# Patient Record
Sex: Female | Born: 1975 | Race: White | Hispanic: No | Marital: Single | Smoking: Never smoker
Health system: Southern US, Community
[De-identification: ages and names within clinical notes are randomized; demographics above are authoritative.]

## PROBLEM LIST (undated history)

## (undated) DIAGNOSIS — N946 Dysmenorrhea, unspecified: Secondary | ICD-10-CM

## (undated) DIAGNOSIS — Z9889 Other specified postprocedural states: Secondary | ICD-10-CM

## (undated) DIAGNOSIS — Z8489 Family history of other specified conditions: Secondary | ICD-10-CM

## (undated) DIAGNOSIS — C439 Malignant melanoma of skin, unspecified: Secondary | ICD-10-CM

## (undated) DIAGNOSIS — R112 Nausea with vomiting, unspecified: Secondary | ICD-10-CM

## (undated) HISTORY — DX: Malignant melanoma of skin, unspecified: C43.9

## (undated) HISTORY — DX: Dysmenorrhea, unspecified: N94.6

## (undated) HISTORY — PX: MANDIBLE SURGERY: SHX707

## (undated) HISTORY — PX: MELANOMA EXCISION: SHX5266

---

## 2002-08-05 HISTORY — PX: ENDOMETRIAL ABLATION: SHX621

## 2005-08-05 HISTORY — PX: BREAST ENHANCEMENT SURGERY: SHX7

## 2012-10-26 DIAGNOSIS — D239 Other benign neoplasm of skin, unspecified: Secondary | ICD-10-CM | POA: Insufficient documentation

## 2018-02-13 ENCOUNTER — Other Ambulatory Visit: Payer: Self-pay

## 2018-02-13 ENCOUNTER — Other Ambulatory Visit (HOSPITAL_COMMUNITY)
Admission: RE | Admit: 2018-02-13 | Discharge: 2018-02-13 | Disposition: A | Discharge status: Home / Self Care | Payer: Managed Care, Other (non HMO) | Source: Ambulatory Visit | Attending: Advanced Practice Midwife | Admitting: Advanced Practice Midwife

## 2018-02-13 ENCOUNTER — Encounter: Payer: Self-pay | Admitting: Advanced Practice Midwife

## 2018-02-13 ENCOUNTER — Ambulatory Visit (INDEPENDENT_AMBULATORY_CARE_PROVIDER_SITE_OTHER): Payer: Managed Care, Other (non HMO) | Admitting: Advanced Practice Midwife

## 2018-02-13 VITALS — BP 110/78 | HR 66 | Ht 64.0 in | Wt 123.0 lb

## 2018-02-13 DIAGNOSIS — Z01419 Encounter for gynecological examination (general) (routine) without abnormal findings: Secondary | ICD-10-CM | POA: Diagnosis present

## 2018-02-13 DIAGNOSIS — Z124 Encounter for screening for malignant neoplasm of cervix: Secondary | ICD-10-CM | POA: Insufficient documentation

## 2018-02-13 DIAGNOSIS — Z1239 Encounter for other screening for malignant neoplasm of breast: Secondary | ICD-10-CM

## 2018-02-13 DIAGNOSIS — Z1231 Encounter for screening mammogram for malignant neoplasm of breast: Secondary | ICD-10-CM | POA: Diagnosis not present

## 2018-02-13 DIAGNOSIS — Z113 Encounter for screening for infections with a predominantly sexual mode of transmission: Secondary | ICD-10-CM | POA: Insufficient documentation

## 2018-02-13 DIAGNOSIS — Z0000 Encounter for general adult medical examination without abnormal findings: Secondary | ICD-10-CM

## 2018-02-13 NOTE — Patient Instructions (Signed)
Mediterranean Diet A Mediterranean diet refers to food and lifestyle choices that are based on the traditions of countries located on the [place]. This way of eating has been shown to help prevent certain conditions and improve outcomes for people who have chronic diseases, like kidney disease and heart disease. What are tips for following this plan? Lifestyle  Cook and eat meals together with your family, when possible.  Drink enough fluid to keep your urine clear or pale yellow.  Be physically active every day. This includes: ? Aerobic exercise like running or swimming. ? Leisure activities like gardening, walking, or housework.  Get 7-8 hours of sleep each night.  If recommended by your health care provider, drink red wine in moderation. This means 1 glass a day for nonpregnant women and 2 glasses a day for men. A glass of wine equals 5 oz (150 mL). Reading food labels  Check the serving size of packaged foods. For foods such as rice and pasta, the serving size refers to the amount of cooked product, not dry.  Check the total fat in packaged foods. Avoid foods that have saturated fat or trans fats.  Check the ingredients list for added sugars, such as corn syrup. Shopping  At the grocery store, buy most of your food from the areas near the walls of the store. This includes: ? Fresh fruits and vegetables (produce). ? Grains, beans, nuts, and seeds. Some of these may be available in unpackaged forms or large amounts (in bulk). ? Fresh seafood. ? Poultry and eggs. ? Low-fat dairy products.  Buy whole ingredients instead of prepackaged foods.  Buy fresh fruits and vegetables in-season from local farmers markets.  Buy frozen fruits and vegetables in resealable bags.  If you do not have access to quality fresh seafood, buy precooked frozen shrimp or canned fish, such as tuna, salmon, or sardines.  Buy small amounts of raw or cooked vegetables, salads, or olives from the  deli or salad bar at your store.  Stock your pantry so you always have certain foods on hand, such as olive oil, canned tuna, canned tomatoes, rice, pasta, and beans. Cooking  Cook foods with extra-virgin olive oil instead of using butter or other vegetable oils.  Have meat as a side dish, and have vegetables or grains as your main dish. This means having meat in small portions or adding small amounts of meat to foods like pasta or stew.  Use beans or vegetables instead of meat in common dishes like chili or lasagna.  Experiment with different cooking methods. Try roasting or broiling vegetables instead of steaming or sauteing them.  Add frozen vegetables to soups, stews, pasta, or rice.  Add nuts or seeds for added healthy fat at each meal. You can add these to yogurt, salads, or vegetable dishes.  Marinate fish or vegetables using olive oil, lemon juice, garlic, and fresh herbs. Meal planning  Plan to eat 1 vegetarian meal one day each week. Try to work up to 2 vegetarian meals, if possible.  Eat seafood 2 or more times a week.  Have healthy snacks readily available, such as: ? Vegetable sticks with hummus. ? Greek yogurt. ? Fruit and nut trail mix.  Eat balanced meals throughout the week. This includes: ? Fruit: 2-3 servings a day ? Vegetables: 4-5 servings a day ? Low-fat dairy: 2 servings a day ? Fish, poultry, or lean meat: 1 serving a day ? Beans and legumes: 2 or more servings a week ? Nuts   and seeds: 1-2 servings a day ? Whole grains: 6-8 servings a day ? Extra-virgin olive oil: 3-4 servings a day  Limit red meat and sweets to only a few servings a month What are my food choices?  Mediterranean diet ? Recommended ? Grains: Whole-grain pasta. Brown rice. Bulgar wheat. Polenta. Couscous. Whole-wheat bread. Oatmeal. Quinoa. ? Vegetables: Artichokes. Beets. Broccoli. Cabbage. Carrots. Eggplant. Green beans. Chard. Kale. Spinach. Onions. Leeks. Peas. Squash.  Tomatoes. Peppers. Radishes. ? Fruits: Apples. Apricots. Avocado. Berries. Bananas. Cherries. Dates. Figs. Grapes. Lemons. Melon. Oranges. Peaches. Plums. Pomegranate. ? Meats and other protein foods: Beans. Almonds. Sunflower seeds. Pine nuts. Peanuts. Cod. Salmon. Scallops. Shrimp. Tuna. Tilapia. Clams. Oysters. Eggs. ? Dairy: Low-fat milk. Cheese. Greek yogurt. ? Beverages: Water. Red wine. Herbal tea. ? Fats and oils: Extra virgin olive oil. Avocado oil. Grape seed oil. ? Sweets and desserts: Greek yogurt with honey. Baked apples. Poached pears. Trail mix. ? Seasoning and other foods: Basil. Cilantro. Coriander. Cumin. Mint. Parsley. Sage. Rosemary. Tarragon. Garlic. Oregano. Thyme. Pepper. Balsalmic vinegar. Tahini. Hummus. Tomato sauce. Olives. Mushrooms. ? Limit these ? Grains: Prepackaged pasta or rice dishes. Prepackaged cereal with added sugar. ? Vegetables: Deep fried potatoes (french fries). ? Fruits: Fruit canned in syrup. ? Meats and other protein foods: Beef. Pork. Lamb. Poultry with skin. Hot dogs. Bacon. ? Dairy: Ice cream. Sour cream. Whole milk. ? Beverages: Juice. Sugar-sweetened soft drinks. Beer. Liquor and spirits. ? Fats and oils: Butter. Canola oil. Vegetable oil. Beef fat (tallow). Lard. ? Sweets and desserts: Cookies. Cakes. Pies. Candy. ? Seasoning and other foods: Mayonnaise. Premade sauces and marinades. ? The items listed may not be a complete list. Talk with your dietitian about what dietary choices are right for you. Summary  The Mediterranean diet includes both food and lifestyle choices.  Eat a variety of fresh fruits and vegetables, beans, nuts, seeds, and whole grains.  Limit the amount of red meat and sweets that you eat.  Talk with your health care provider about whether it is safe for you to drink red wine in moderation. This means 1 glass a day for nonpregnant women and 2 glasses a day for men. A glass of wine equals 5 oz (150 mL). This information  is not intended to replace advice given to you by your health care provider. Make sure you discuss any questions you have with your health care provider. Document Released: 03/14/2016 Document Revised: 04/16/2016 Document Reviewed: 03/14/2016 Elsevier Interactive Patient Education  2018 [employer] American Heart Association (AHA) Exercise Recommendation  Being physically active is important to prevent heart disease and stroke, the nation's No. 1and No. 5killers. To improve overall cardiovascular health, we suggest at least 150 minutes per week of moderate exercise or 75 minutes per week of vigorous exercise (or a combination of moderate and vigorous activity). Thirty minutes a day, five times a week is an easy goal to remember. You will also experience benefits even if you divide your time into two or three segments of 10 to 15 minutes per day.  For people who would benefit from lowering their blood pressure or cholesterol, we recommend 40 minutes of aerobic exercise of moderate to vigorous intensity three to four times a week to lower the risk for heart attack and stroke.  Physical activity is anything that makes you move your body and burn calories.  This includes things like climbing stairs or playing sports. Aerobic exercises benefit your heart, and include walking, jogging, swimming or biking. Strength and   stretching exercises are best for overall stamina and flexibility.  The simplest, positive change you can make to effectively improve your heart health is to start walking. It's enjoyable, free, easy, social and great exercise. A walking program is flexible and boasts high success rates because people can stick with it. It's easy for walking to become a regular and satisfying part of life.   For Overall Cardiovascular Health:  At least 30 minutes of moderate-intensity aerobic activity at least 5 days per week for a total of 150  OR   At least 25 minutes of vigorous aerobic activity  at least 3 days per week for a total of 75 minutes; or a combination of moderate- and vigorous-intensity aerobic activity  AND   Moderate- to high-intensity muscle-strengthening activity at least 2 days per week for additional health benefits.  For Lowering Blood Pressure and Cholesterol  An average 40 minutes of moderate- to vigorous-intensity aerobic activity 3 or 4 times per week  What if I can't make it to the time goal? Something is always better than nothing! And everyone has to start somewhere. Even if you've been sedentary for years, today is the day you can begin to make healthy changes in your life. If you don't think you'll make it for 30 or 40 minutes, set a reachable goal for today. You can work up toward your overall goal by increasing your time as you get stronger. Don't let all-or-nothing thinking rob you of doing what you can every day.  Source:http://www.heart.org   Health Maintenance, Female Adopting a healthy lifestyle and getting preventive care can go a long way to promote health and wellness. Talk with your health care provider about what schedule of regular examinations is right for you. This is a good chance for you to check in with your provider about disease prevention and staying healthy. In between checkups, there are plenty of things you can do on your own. Experts have done a lot of research about which lifestyle changes and preventive measures are most likely to keep you healthy. Ask your health care provider for more information. Weight and diet Eat a healthy diet  Be sure to include plenty of vegetables, fruits, low-fat dairy products, and lean protein.  Do not eat a lot of foods high in solid fats, added sugars, or salt.  Get regular exercise. This is one of the most important things you can do for your health. ? Most adults should exercise for at least 150 minutes each week. The exercise should increase your heart rate and make you sweat  (moderate-intensity exercise). ? Most adults should also do strengthening exercises at least twice a week. This is in addition to the moderate-intensity exercise.  Maintain a healthy weight  Body mass index (BMI) is a measurement that can be used to identify possible weight problems. It estimates body fat based on height and weight. Your health care provider can help determine your BMI and help you achieve or maintain a healthy weight.  For females 20 years of age and older: ? A BMI below 18.5 is considered underweight. ? A BMI of 18.5 to 24.9 is normal. ? A BMI of 25 to 29.9 is considered overweight. ? A BMI of 30 and above is considered obese.  Watch levels of cholesterol and blood lipids  You should start having your blood tested for lipids and cholesterol at 41 years of age, then have this test every 5 years.  You may need to have your cholesterol levels   checked more often if: ? Your lipid or cholesterol levels are high. ? You are older than 41 years of age. ? You are at high risk for heart disease.  Cancer screening Lung Cancer  Lung cancer screening is recommended for adults 64-5 years old who are at high risk for lung cancer because of a history of smoking.  A yearly low-dose CT scan of the lungs is recommended for people who: ? Currently smoke. ? Have quit within the past 15 years. ? Have at least a 30-pack-year history of smoking. A pack year is smoking an average of one pack of cigarettes a day for 1 year.  Yearly screening should continue until it has been 15 years since you quit.  Yearly screening should stop if you develop a health problem that would prevent you from having lung cancer treatment.  Breast Cancer  Practice breast self-awareness. This means understanding how your breasts normally appear and feel.  It also means doing regular breast self-exams. Let your health care provider know about any changes, no matter how small.  If you are in your 20s or  30s, you should have a clinical breast exam (CBE) by a health care provider every 1-3 years as part of a regular health exam.  If you are 56 or older, have a CBE every year. Also consider having a breast X-ray (mammogram) every year.  If you have a family history of breast cancer, talk to your health care provider about genetic screening.  If you are at high risk for breast cancer, talk to your health care provider about having an MRI and a mammogram every year.  Breast cancer gene (BRCA) assessment is recommended for women who have family members with BRCA-related cancers. BRCA-related cancers include: ? Breast. ? Ovarian. ? Tubal. ? Peritoneal cancers.  Results of the assessment will determine the need for genetic counseling and BRCA1 and BRCA2 testing.  Cervical Cancer Your health care provider may recommend that you be screened regularly for cancer of the pelvic organs (ovaries, uterus, and vagina). This screening involves a pelvic examination, including checking for microscopic changes to the surface of your cervix (Pap test). You may be encouraged to have this screening done every 3 years, beginning at age 29.  For women ages 3-65, health care providers may recommend pelvic exams and Pap testing every 3 years, or they may recommend the Pap and pelvic exam, combined with testing for human papilloma virus (HPV), every 5 years. Some types of HPV increase your risk of cervical cancer. Testing for HPV may also be done on women of any age with unclear Pap test results.  Other health care providers may not recommend any screening for nonpregnant women who are considered low risk for pelvic cancer and who do not have symptoms. Ask your health care provider if a screening pelvic exam is right for you.  If you have had past treatment for cervical cancer or a condition that could lead to cancer, you need Pap tests and screening for cancer for at least 20 years after your treatment. If Pap tests  have been discontinued, your risk factors (such as having a new sexual partner) need to be reassessed to determine if screening should resume. Some women have medical problems that increase the chance of getting cervical cancer. In these cases, your health care provider may recommend more frequent screening and Pap tests.  Colorectal Cancer  This type of cancer can be detected and often prevented.  Routine colorectal cancer screening  usually begins at 41 years of age and continues through 41 years of age.  Your health care provider may recommend screening at an earlier age if you have risk factors for colon cancer.  Your health care provider may also recommend using home test kits to check for hidden blood in the stool.  A small camera at the end of a tube can be used to examine your colon directly (sigmoidoscopy or colonoscopy). This is done to check for the earliest forms of colorectal cancer.  Routine screening usually begins at age 8.  Direct examination of the colon should be repeated every 5-10 years through 41 years of age. However, you may need to be screened more often if early forms of precancerous polyps or small growths are found.  Skin Cancer  Check your skin from head to toe regularly.  Tell your health care provider about any new moles or changes in moles, especially if there is a change in a mole's shape or color.  Also tell your health care provider if you have a mole that is larger than the size of a pencil eraser.  Always use sunscreen. Apply sunscreen liberally and repeatedly throughout the day.  Protect yourself by wearing long sleeves, pants, a wide-brimmed hat, and sunglasses whenever you are outside.  Heart disease, diabetes, and high blood pressure  High blood pressure causes heart disease and increases the risk of stroke. High blood pressure is more likely to develop in: ? People who have blood pressure in the high end of the normal range (130-139/85-89 mm  Hg). ? People who are overweight or obese. ? People who are African American.  If you are 29-23 years of age, have your blood pressure checked every 3-5 years. If you are 68 years of age or older, have your blood pressure checked every year. You should have your blood pressure measured twice-once when you are at a hospital or clinic, and once when you are not at a hospital or clinic. Record the average of the two measurements. To check your blood pressure when you are not at a hospital or clinic, you can use: ? An automated blood pressure machine at a pharmacy. ? A home blood pressure monitor.  If you are between 82 years and 71 years old, ask your health care provider if you should take aspirin to prevent strokes.  Have regular diabetes screenings. This involves taking a blood sample to check your fasting blood sugar level. ? If you are at a normal weight and have a low risk for diabetes, have this test once every three years after 41 years of age. ? If you are overweight and have a high risk for diabetes, consider being tested at a younger age or more often. Preventing infection Hepatitis B  If you have a higher risk for hepatitis B, you should be screened for this virus. You are considered at high risk for hepatitis B if: ? You were born in a country where hepatitis B is common. Ask your health care provider which countries are considered high risk. ? Your parents were born in a high-risk country, and you have not been immunized against hepatitis B (hepatitis B vaccine). ? You have HIV or AIDS. ? You use needles to inject street drugs. ? You live with someone who has hepatitis B. ? You have had sex with someone who has hepatitis B. ? You get hemodialysis treatment. ? You take certain medicines for conditions, including cancer, organ transplantation, and autoimmune conditions.  Hepatitis C  Blood testing is recommended for: ? Everyone born from [birth year] through 1964. ? Anyone with known  risk factors for hepatitis C.  Sexually transmitted infections (STIs)  You should be screened for sexually transmitted infections (STIs) including gonorrhea and chlamydia if: ? You are sexually active and are younger than 41 years of age. ? You are older than 41 years of age and your health care provider tells you that you are at risk for this type of infection. ? Your sexual activity has changed since you were last screened and you are at an increased risk for chlamydia or gonorrhea. Ask your health care provider if you are at risk.  If you do not have HIV, but are at risk, it may be recommended that you take a prescription medicine daily to prevent HIV infection. This is called pre-exposure prophylaxis (PrEP). You are considered at risk if: ? You are sexually active and do not regularly use condoms or know the HIV status of your partner(s). ? You take drugs by injection. ? You are sexually active with a partner who has HIV.  Talk with your health care provider about whether you are at high risk of being infected with HIV. If you choose to begin PrEP, you should first be tested for HIV. You should then be tested every 3 months for as long as you are taking PrEP. Pregnancy  If you are premenopausal and you may become pregnant, ask your health care provider about preconception counseling.  If you may become pregnant, take 400 to 800 micrograms (mcg) of folic acid every day.  If you want to prevent pregnancy, talk to your health care provider about birth control (contraception). Osteoporosis and menopause  Osteoporosis is a disease in which the bones lose minerals and strength with aging. This can result in serious bone fractures. Your risk for osteoporosis can be identified using a bone density scan.  If you are 74 years of age or older, or if you are at risk for osteoporosis and fractures, ask your health care provider if you should be screened.  Ask your health care provider whether you  should take a calcium or vitamin D supplement to lower your risk for osteoporosis.  Menopause may have certain physical symptoms and risks.  Hormone replacement therapy may reduce some of these symptoms and risks. Talk to your health care provider about whether hormone replacement therapy is right for you. Follow these instructions at home:  Schedule regular health, dental, and eye exams.  Stay current with your immunizations.  Do not use any tobacco products including cigarettes, chewing tobacco, or electronic cigarettes.  If you are pregnant, do not drink alcohol.  If you are breastfeeding, limit how much and how often you drink alcohol.  Limit alcohol intake to no more than 1 drink per day for nonpregnant women. One drink equals 12 ounces of beer, 5 ounces of wine, or 1 ounces of hard liquor.  Do not use street drugs.  Do not share needles.  Ask your health care provider for help if you need support or information about quitting drugs.  Tell your health care provider if you often feel depressed.  Tell your health care provider if you have ever been abused or do not feel safe at home. This information is not intended to replace advice given to you by your health care provider. Make sure you discuss any questions you have with your health care provider. Document Released: 02/04/2011 Document Revised: 12/28/2015 Document Reviewed:  04/25/2015 Elsevier Interactive Patient Education  [employer].

## 2018-02-13 NOTE — Progress Notes (Signed)
Patient ID: Leah Moses, female   DOB: 07-05-1976, 42 y.o.   MRN: [id number]    Gynecology Annual Exam  PCP: Patient, No Pcp Per  Chief Complaint:  Chief Complaint  Patient presents with  . Gynecologic Exam    Extreme vaginal pain 2 wks ago/subsided    History of Present Illness: Patient is a 42 y.o. G2P1011 presents for annual exam. The patient has complaint today of about 2 weeks ago she experienced 5 minutes of constant sharp pain in her vagina while she was sitting on her riding lawn mower. She then realized she hadn't had an annual exam in a number of years so she scheduled one. The pain has not recurred since then. She has no other concerns. Discussion of possible acute nerve pain.  LMP: Patient's last menstrual period was 01/23/2018. Average Interval: regular, 28 days Duration of flow: 4 days Heavy Menses: no Clots: no Intermenstrual Bleeding: no Postcoital Bleeding: no Dysmenorrhea: no   The patient is sexually active. She currently uses vasectomy for contraception. She denies dyspareunia.  The patient does not perform self breast exams.  There is no notable family history of breast or ovarian cancer in her family.  The patient wears seatbelts: yes.   The patient has regular exercise: yes. She admits healthy diet.    The patient denies current symptoms of depression.    Review of Systems: Review of Systems  Constitutional: Negative.   HENT: Negative.   Eyes: Negative.   Respiratory: Negative.   Cardiovascular: Negative.   Gastrointestinal: Negative.   Genitourinary: Negative.   Musculoskeletal: Negative.   Skin: Negative.   Neurological: Negative.   Endo/Heme/Allergies: Negative.   Psychiatric/Behavioral: Negative.     Past Medical History:  Past Medical History:  Diagnosis Date  . Dysmenorrhea   . Melanoma ([place])    Chest, leg, shoulder, back, ankle    Past Surgical History:  Melanoma Excision Breast augmentation  Gynecologic History:  Patient's  last menstrual period was 01/23/2018. Contraception: vasectomy Last Pap: 5 years ago Results were:  no abnormalities  Last mammogram: 5 years ago Results were: BI-RAD I  Obstetric History: G2P1011  Family History:  No family history on file.  Social History:  Social History   Socioeconomic History  . Marital status: Single    Spouse name: Not on file  . Number of children: Not on file  . Years of education: Not on file  . Highest education level: Not on file  Occupational History  . Occupation: Biochemist, clinical  Social Needs  . Financial resource strain: Not on file  . Food insecurity:    Worry: Not on file    Inability: Not on file  . Transportation needs:    Medical: Not on file    Non-medical: Not on file  Tobacco Use  . Smoking status: Never Smoker  . Smokeless tobacco: Never Used  Substance and Sexual Activity  . Alcohol use: Yes    Comment: 1-2/month  . Drug use: Never  . Sexual activity: Yes    Birth control/protection: None  Lifestyle  . Physical activity:    Days per week: 2 days    Minutes per session: 30 min  . Stress: Not on file  Relationships  . Social connections:    Talks on phone: Not on file    Gets together: Not on file    Attends religious service: Not on file    Active member of club or organization: Not on file    Attends meetings  of clubs or organizations: Not on file    Relationship status: Not on file  . Intimate partner violence:    Fear of current or ex partner: Not on file    Emotionally abused: Not on file    Physically abused: Not on file    Forced sexual activity: Not on file  Other Topics Concern  . Not on file  Social History Narrative  . Not on file    Allergies:  No Known Allergies  Medications: Prior to Admission medications   Not on File    Physical Exam Vitals: Blood pressure 110/78, pulse 66, height 5\' 4"  (1.626 m), weight 123 lb (55.8 kg), last menstrual period 01/23/2018.  General: NAD HEENT: normocephalic,  anicteric Thyroid: no enlargement, no palpable nodules Pulmonary: No increased work of breathing, CTAB Cardiovascular: RRR, distal pulses 2+ Breast: Breast symmetrical, no tenderness, no palpable nodules or masses, no skin or nipple retraction present, no nipple discharge.  No axillary or supraclavicular lymphadenopathy. Evidence of breast augmentation surgery Abdomen: NABS, soft, non-tender, non-distended.  Umbilicus without lesions.  No hepatomegaly, splenomegaly or masses palpable. No evidence of hernia  Genitourinary:  External: Normal external female genitalia.  Normal urethral meatus, normal Bartholin's and Skene's glands.    Vagina: Normal vaginal mucosa, no evidence of prolapse. No lesion or abnormality seen that would explain recent severe pain  Cervix: Grossly normal in appearance, no bleeding, no CMT  Uterus: deferred for no concerns/shared decision making    Adnexa: deferred for no concerns/shared decision making  Rectal: deferred  Lymphatic: no evidence of inguinal lymphadenopathy Extremities: no edema, erythema, or tenderness Neurologic: Grossly intact Psychiatric: mood appropriate, affect full   Assessment: 42 y.o. G2P1011 routine annual exam  Plan: Problem List Items Addressed This Visit    None    Visit Diagnoses    Well woman exam with routine gynecological exam    -  Primary   Relevant Orders   Cytology - PAP   MM DIGITAL SCREENING BILATERAL   Screen for sexually transmitted diseases       Relevant Orders   Cytology - PAP   Hepatitis B surface antibody   Hepatitis C antibody   HIV antibody   RPR Qual   Cervical cancer screening       Relevant Orders   Cytology - PAP   Blood tests for routine general physical examination       Relevant Orders   CBC   Lipid Panel With LDL/HDL Ratio   Breast cancer screening       Relevant Orders   MM DIGITAL SCREENING BILATERAL      1) Mammogram - recommend yearly screening mammogram.  Mammogram Was ordered  today   2) STI screening  wasoffered and accepted  3) ASCCP guidelines and rational discussed.  Patient opts for every 5 years screening interval  4) Contraception - the patient is currently using  vasectomy.  She is happy with her current form of contraception and plans to continue  5) Colonoscopy -- Screening recommended starting at age 25 for average risk individuals, age 46 for individuals deemed at increased risk (including African Americans) and recommended to continue until age 53.  For patient age 39-85 individualized approach is recommended.  Gold standard screening is via colonoscopy, Cologuard screening is an acceptable alternative for patient unwilling or unable to undergo colonoscopy.  "Colorectal cancer screening for average?risk adults: 2018 guideline update from the American Cancer Society"CA: A Cancer Journal for Clinicians: Jan 01, 2017  6) Routine healthcare maintenance including cholesterol, diabetes screening discussed Ordered today  7) Return in 1 year (on 02/14/2019).   Rod Can, [facility] 02/13/2018, 1:02 PM

## 2018-02-14 LAB — CBC
HEMATOCRIT: 43.9 % (ref 34.0–46.6)
HEMOGLOBIN: 14.6 g/dL (ref 11.1–15.9)
MCH: 32.3 pg (ref 26.6–33.0)
MCHC: 33.3 g/dL (ref 31.5–35.7)
MCV: 97 fL (ref 79–97)
Platelets: 342 10*3/uL (ref 150–450)
RBC: 4.52 x10E6/uL (ref 3.77–5.28)
RDW: 12.4 % (ref 12.3–15.4)
WBC: 8.2 10*3/uL (ref 3.4–10.8)

## 2018-02-14 LAB — LIPID PANEL WITH LDL/HDL RATIO
CHOLESTEROL TOTAL: 180 mg/dL (ref 100–199)
HDL: 66 mg/dL (ref >39)
LDL CALC: 96 mg/dL (ref 0–99)
LDL/HDL RATIO: 1.5 ratio (ref 0.0–3.2)
TRIGLYCERIDES: 90 mg/dL (ref 0–149)
VLDL Cholesterol Cal: 18 mg/dL (ref 5–40)

## 2018-02-14 LAB — RPR QUALITATIVE: RPR Ser Ql: NONREACTIVE

## 2018-02-14 LAB — HIV ANTIBODY (ROUTINE TESTING W REFLEX): HIV Screen 4th Generation wRfx: NONREACTIVE

## 2018-02-14 LAB — HEPATITIS B SURFACE ANTIBODY,QUALITATIVE: HEP B SURFACE AB, QUAL: NONREACTIVE

## 2018-02-14 LAB — HEPATITIS C ANTIBODY: Hep C Virus Ab: <0.1 s/co ratio (ref 0.0–0.9)

## 2018-02-16 LAB — CYTOLOGY - PAP
CHLAMYDIA, DNA PROBE: NEGATIVE
Diagnosis: NEGATIVE
HPV: NOT DETECTED
Neisseria Gonorrhea: NEGATIVE
Trichomonas: NEGATIVE

## 2018-02-18 LAB — CERVICOVAGINAL ANCILLARY ONLY: Herpes: NEGATIVE

## 2019-02-17 ENCOUNTER — Ambulatory Visit: Payer: Managed Care, Other (non HMO) | Admitting: Advanced Practice Midwife

## 2020-04-19 ENCOUNTER — Encounter: Payer: Self-pay | Admitting: Advanced Practice Midwife

## 2020-04-19 ENCOUNTER — Other Ambulatory Visit: Payer: Self-pay

## 2020-04-19 ENCOUNTER — Ambulatory Visit (INDEPENDENT_AMBULATORY_CARE_PROVIDER_SITE_OTHER): Payer: No Typology Code available for payment source | Admitting: Advanced Practice Midwife

## 2020-04-19 VITALS — BP 110/70 | HR 88 | Ht 64.0 in | Wt 124.0 lb

## 2020-04-19 DIAGNOSIS — N924 Excessive bleeding in the premenopausal period: Secondary | ICD-10-CM

## 2020-04-19 DIAGNOSIS — Z01419 Encounter for gynecological examination (general) (routine) without abnormal findings: Secondary | ICD-10-CM | POA: Diagnosis not present

## 2020-04-19 DIAGNOSIS — Z1239 Encounter for other screening for malignant neoplasm of breast: Secondary | ICD-10-CM | POA: Diagnosis not present

## 2020-04-19 MED ORDER — NORETHIN ACE-ETH ESTRAD-FE 1-20 MG-MCG PO TABS: 1.0000 | ORAL_TABLET | Freq: Every day | ORAL | 4 refills | Status: DC

## 2020-04-19 NOTE — Progress Notes (Signed)
Gynecology Annual Exam  PCP: Patient, No Pcp Per  Chief Complaint:  Chief Complaint  Patient presents with  . Gynecologic Exam  . Menorrhagia    heavy bleeding with cycles, bleed through pada/tampons x4 months    History of Present Illness: Patient is a 44 y.o. G2P1011 presents for annual exam. The patient has complaint today of heavy menstrual bleeding for the last 3 or 4 months. She changes a pad and tampon more than once an hour for 5 days followed by 1 or 2 light days. She has mild cramps and no clots. We discussed hormonal changes in perimenopausal time vs pathology such as fibroids, and the various options for treatment. She tried NSAIDS without relief. She would like to try OCPs.  LMP: Patient's last menstrual period was 04/12/2020. Average Interval: regular, every 30-32 days days Duration of flow: 5-7 days Heavy Menses: yes Clots: no Intermenstrual Bleeding: no Postcoital Bleeding: no Dysmenorrhea: mild   The patient is sexually active. She currently uses vasectomy for contraception. She denies dyspareunia.  The patient does perform self breast exams.  There is no notable family history of breast or ovarian cancer in her family.  The patient wears seatbelts: yes.   The patient has regular exercise: she runs, lifts and rides horses regularly. She admits healthy lifestyle diet, hydration and sleep. She admits some increase in stress with her job.    The patient denies current symptoms of depression.    Review of Systems: Review of Systems  Constitutional: Negative for chills and fever.  HENT: Negative for congestion, ear discharge, ear pain, hearing loss, sinus pain and sore throat.   Eyes: Negative for blurred vision and double vision.  Respiratory: Negative for cough, shortness of breath and wheezing.   Cardiovascular: Negative for chest pain, palpitations and leg swelling.  Gastrointestinal: Negative for abdominal pain, blood in stool, constipation, diarrhea,  heartburn, melena, nausea and vomiting.  Genitourinary: Negative for dysuria, flank pain, frequency, hematuria and urgency.       Positive for heavy menstrual bleeding  Musculoskeletal: Negative for back pain, joint pain and myalgias.  Skin: Negative for itching and rash.  Neurological: Negative for dizziness, tingling, tremors, sensory change, speech change, focal weakness, seizures, loss of consciousness, weakness and headaches.  Endo/Heme/Allergies: Negative for environmental allergies. Does not bruise/bleed easily.  Psychiatric/Behavioral: Negative for depression, hallucinations, memory loss, substance abuse and suicidal ideas. The patient is not nervous/anxious and does not have insomnia.     Past Medical History:  Patient Active Problem List   Diagnosis Date Noted  . Dysplastic nevus 10/26/2012    3 dysplastic nevus removed in Jan 2014     Past Surgical History:  Past Surgical History:  Procedure Laterality Date  . MELANOMA EXCISION     Multiple sites/dates    Gynecologic History:  Patient's last menstrual period was 04/12/2020. Contraception: vasectomy Last Pap: 2 years ago Results were:  no abnormalities  Last mammogram: about 9 years ago Results were: normal per patient  Obstetric History: G2P1011  Family History:  History reviewed. No pertinent family history.  Social History:  Social History   Socioeconomic History  . Marital status: Single    Spouse name: Not on file  . Number of children: Not on file  . Years of education: Not on file  . Highest education level: Not on file  Occupational History  . Occupation: Biochemist, clinical  Tobacco Use  . Smoking status: Never Smoker  . Smokeless tobacco: Never Used  Vaping  Use  . Vaping Use: Never used  Substance and Sexual Activity  . Alcohol use: Yes    Comment: 1-2/month  . Drug use: Never  . Sexual activity: Yes    Birth control/protection: None  Other Topics Concern  . Not on file  Social History Narrative    . Not on file   Social Determinants of Health   Financial Resource Strain:   . Difficulty of Paying Living Expenses: Not on file  Food Insecurity:   . Worried About Charity fundraiser in the Last Year: Not on file  . Ran Out of Food in the Last Year: Not on file  Transportation Needs:   . Lack of Transportation (Medical): Not on file  . Lack of Transportation (Non-Medical): Not on file  Physical Activity:   . Days of Exercise per Week: Not on file  . Minutes of Exercise per Session: Not on file  Stress:   . Feeling of Stress : Not on file  Social Connections:   . Frequency of Communication with Friends and Family: Not on file  . Frequency of Social Gatherings with Friends and Family: Not on file  . Attends Religious Services: Not on file  . Active Member of Clubs or Organizations: Not on file  . Attends Archivist Meetings: Not on file  . Marital Status: Not on file  Intimate Partner Violence:   . Fear of Current or Ex-Partner: Not on file  . Emotionally Abused: Not on file  . Physically Abused: Not on file  . Sexually Abused: Not on file    Allergies:  No Known Allergies  Medications: Prior to Admission medications   Medication Sig Start Date End Date Taking? Authorizing Provider  norethindrone-ethinyl estradiol (JUNEL FE 1/20) 1-20 MG-MCG tablet Take 1 tablet by mouth daily. 04/19/20   Rod Can, CNM    Physical Exam Vitals: Blood pressure 110/70, pulse 88, height 5\' 4"  (1.626 m), weight 124 lb (56.2 kg), last menstrual period 04/12/2020.  General: NAD HEENT: normocephalic, anicteric Thyroid: no enlargement, no palpable nodules Pulmonary: No increased work of breathing, CTAB Cardiovascular: RRR, distal pulses 2+ Breast: Breast symmetrical, no tenderness, no palpable nodules or masses, no skin or nipple retraction present, no nipple discharge.  No axillary or supraclavicular lymphadenopathy. Abdomen: NABS, soft, non-tender, non-distended.  Umbilicus  without lesions.  No hepatomegaly, splenomegaly or masses palpable. No evidence of hernia  Genitourinary:  External: Normal external female genitalia.  Normal urethral meatus, normal Bartholin's and Skene's glands.    Vagina: Normal vaginal mucosa, no evidence of prolapse.    Cervix:  no CMT, blood on exam glove from period bleeding  Uterus: Non-enlarged, mobile, normal contour.    Adnexa: ovaries non-enlarged, no adnexal masses  Rectal: deferred  Lymphatic: no evidence of inguinal lymphadenopathy Extremities: no edema, erythema, or tenderness Neurologic: Grossly intact Psychiatric: mood appropriate, affect full   Assessment: 44 y.o. G2P1011 routine annual exam, menorrhagia  Plan: Problem List Items Addressed This Visit    None    Visit Diagnoses    Well woman exam with routine gynecological exam    -  Primary   Encounter for screening for malignant neoplasm of breast, unspecified screening modality       Relevant Orders   MM DIGITAL SCREENING BILATERAL   Excessive bleeding in premenopausal period       Relevant Medications   norethindrone-ethinyl estradiol (JUNEL FE 1/20) 1-20 MG-MCG tablet      1) Mammogram - recommend yearly screening mammogram.  Mammogram Was ordered today  2) STI screening  was offered and declined  3) ASCCP guidelines and rationale discussed.  Patient opts for every 3 years screening interval. PAP due next year  4) Contraception - the patient is currently using  vasectomy.  She is happy with her current form of contraception and plans to continue  5) Colonoscopy -- Screening recommended starting at age 59 for average risk individuals, age 50 for individuals deemed at increased risk (including African Americans) and recommended to continue until age 67.  For patient age 79-85 individualized approach is recommended.  Gold standard screening is via colonoscopy, Cologuard screening is an acceptable alternative for patient unwilling or unable to undergo  colonoscopy.  "Colorectal cancer screening for average?risk adults: 2018 guideline update from the American Cancer Society"CA: A Cancer Journal for Clinicians: Jan 01, 2017   6) Routine healthcare maintenance including cholesterol, diabetes screening discussed Declines   7) Menorrhagia: trial of OCPs, if bleeding continues to be heavy consider pelvic ultrasound, ablation  8) Return in about 1 year (around 04/19/2021) for annual established gyn.   Rod Can, [facility] 04/19/2020, 3:56 PM

## 2020-04-19 NOTE — Patient Instructions (Signed)

## 2020-07-10 ENCOUNTER — Telehealth: Payer: Self-pay

## 2020-07-10 NOTE — Telephone Encounter (Signed)
Pt calling triage today. States that since she has been on her new BC, her heavy periods have gotten even worse. Going through pad AND tampon every hour. Very painful cycles. "cannot function" really weak. Called pt, per guidelines, she needs to go to ER to be seen since she is bleeding that heavily then follow up here with provider. LM with pt

## 2020-07-10 NOTE — Telephone Encounter (Signed)
Pt coming to see jane 12/8 at 8:50 in [place] office. Please schedule. I sent message to carla too.

## 2020-07-12 ENCOUNTER — Encounter: Payer: Self-pay | Admitting: Advanced Practice Midwife

## 2020-07-12 ENCOUNTER — Other Ambulatory Visit: Payer: Self-pay

## 2020-07-12 ENCOUNTER — Ambulatory Visit (INDEPENDENT_AMBULATORY_CARE_PROVIDER_SITE_OTHER): Payer: No Typology Code available for payment source | Admitting: Advanced Practice Midwife

## 2020-07-12 VITALS — BP 107/69 | Ht 64.0 in | Wt 123.0 lb

## 2020-07-12 DIAGNOSIS — N924 Excessive bleeding in the premenopausal period: Secondary | ICD-10-CM | POA: Diagnosis not present

## 2020-07-12 MED ORDER — MEDROXYPROGESTERONE ACETATE 10 MG PO TABS: 10.0000 mg | ORAL_TABLET | Freq: Every day | ORAL | 2 refills | Status: DC

## 2020-07-12 NOTE — Progress Notes (Signed)
Patient ID: Leah Moses, female   DOB: [birth year], 44 y.o.   MRN: [id number]  Reason for Visit: Vaginal Bleeding (Heavy bleeding with lots of big clots, feeling fatigue, also bad cramps. Bleeding x 2wks. Procedure RM.)    Subjective:  HPI:  Leah Moses is a 44 y.o. female being seen for a heavy and prolonged menstrual period. Prior to March of this year she had regular monthly cycles lasting 4 days of moderate flow. Since March 2021 her cycles have been monthly lasting 7-8 days and of heavy flow soaking a pad q hour on most days of the cycle. Her most recent cycle has lasted for 17 days and may be tapering down at this point. It has been heavy most of the days and she has had quarter size clots and cramping at times She has been on Junel- low dose OCP since September of this year. She previously tried NSAIDS without relief. We discussed various treatment options including hormones, imaging of uterus, lab work, uterine ablation, hysterectomy. She would like to start with labs and imaging. She may consider Mirena IUD after the imaging results.  Past Medical History:  Diagnosis Date  . Dysmenorrhea   . Melanoma ([place])    Chest, leg, shoulder, back, ankle   History reviewed. No pertinent family history. Past Surgical History:  Procedure Laterality Date  . MELANOMA EXCISION     Multiple sites/dates    Short Social History:  Social History   Tobacco Use  . Smoking status: Never Smoker  . Smokeless tobacco: Never Used  Substance Use Topics  . Alcohol use: Yes    Comment: 1-2/month    No Known Allergies  Current Outpatient Medications  Medication Sig Dispense Refill  . medroxyPROGESTERone (PROVERA) 10 MG tablet Take 1 tablet (10 mg total) by mouth daily. Use for ten days 10 tablet 2  . norethindrone-ethinyl estradiol (JUNEL FE 1/20) 1-20 MG-MCG tablet Take 1 tablet by mouth daily. 84 tablet 4   No current facility-administered medications for this visit.    Review of  Systems  Constitutional: Negative for chills and fever.  HENT: Negative for congestion, ear discharge, ear pain, hearing loss, sinus pain and sore throat.   Eyes: Negative for blurred vision and double vision.  Respiratory: Negative for cough, shortness of breath and wheezing.   Cardiovascular: Negative for chest pain, palpitations and leg swelling.  Gastrointestinal: Negative for abdominal pain, blood in stool, constipation, diarrhea, heartburn, melena, nausea and vomiting.  Genitourinary: Negative for dysuria, flank pain, frequency, hematuria and urgency.       Positive for heavy and prolonged menstrual bleeding in current cycle and heavy bleeding with cycles in general for the past 9 months  Musculoskeletal: Negative for back pain, joint pain and myalgias.  Skin: Negative for itching and rash.  Neurological: Negative for dizziness, tingling, tremors, sensory change, speech change, focal weakness, seizures, loss of consciousness, weakness and headaches.  Endo/Heme/Allergies: Negative for environmental allergies. Does not bruise/bleed easily.  Psychiatric/Behavioral: Negative for depression, hallucinations, memory loss, substance abuse and suicidal ideas. The patient is not nervous/anxious and does not have insomnia.         Objective:  Objective   Vitals:   07/12/20 0859  BP: 107/69  Weight: 123 lb (55.8 kg)  Height: 5\' 4"  (1.626 m)   Body mass index is 21.11 kg/m. Constitutional: Well nourished, well developed female in no acute distress.  HEENT: normal Skin: Warm and dry.  Respiratory:  Normal respiratory effort Neuro: DTRs 2+, Cranial  nerves grossly intact Psych: Alert and Oriented x3. No memory deficits. Normal mood and affect.  MS: normal gait, normal bilateral lower extremity ROM/strength/stability.   Assessment/Plan:   44 y.o. G2 P68 female with heavy and prolonged bleeding during current cycle and heavy bleeding with regular cycles x 9 months  Rx Provera Gyn  ultrasound and follow up visit in the next week Labs: CBC, [facility] 07/12/2020, 9:53 AM

## 2020-07-12 NOTE — Progress Notes (Signed)
Heavy bleeding with lots of big clots, feeling fatigue, also bad cramps. Bleeding x 2wks. Procedure RM.

## 2020-07-12 NOTE — Patient Instructions (Addendum)
Endometrial Ablation Endometrial ablation is a procedure that destroys the thin inner layer of the lining of the uterus (endometrium). This procedure may be done:  To stop heavy periods.  To stop bleeding that is causing anemia.  To control irregular bleeding.  To treat bleeding caused by small tumors (fibroids) in the endometrium. This procedure is often an alternative to major surgery, such as removal of the uterus and cervix (hysterectomy). As a result of this procedure:  You may not be able to have children. However, if you are premenopausal (you have not gone through menopause): ? You may still have a small chance of getting pregnant. ? You will need to use a reliable method of birth control after the procedure to prevent pregnancy.  You may stop having a menstrual period, or you may have only a small amount of bleeding during your period. Menstruation may return several years after the procedure. Tell a health care provider about:  Any allergies you have.  All medicines you are taking, including vitamins, herbs, eye drops, creams, and over-the-counter medicines.  Any problems you or family members have had with the use of anesthetic medicines.  Any blood disorders you have.  Any surgeries you have had.  Any medical conditions you have. What are the risks? Generally, this is a safe procedure. However, problems may occur, including:  A hole (perforation) in the uterus or bowel.  Infection of the uterus, bladder, or vagina.  Bleeding.  Damage to other structures or organs.  An air bubble in the lung (air embolus).  Problems with pregnancy after the procedure.  Failure of the procedure.  Decreased ability to diagnose cancer in the endometrium. What happens before the procedure?  You will have tests of your endometrium to make sure there are no pre-cancerous cells or cancer cells present.  You may have an ultrasound of the uterus.  You may be given medicines to  thin the endometrium.  Ask your health care provider about: ? Changing or stopping your regular medicines. This is especially important if you take diabetes medicines or blood thinners. ? Taking medicines such as aspirin and ibuprofen. These medicines can thin your blood. Do not take these medicines before your procedure if your doctor tells you not to.  Plan to have someone take you home from the hospital or clinic. What happens during the procedure?   You will lie on an exam table with your feet and legs supported as in a pelvic exam.  To lower your risk of infection: ? Your health care team will wash or sanitize their hands and put on germ-free (sterile) gloves. ? Your genital area will be washed with soap.  An IV tube will be inserted into one of your veins.  You will be given a medicine to help you relax (sedative).  A surgical instrument with a light and camera (resectoscope) will be inserted into your vagina and moved into your uterus. This allows your surgeon to see inside your uterus.  Endometrial tissue will be removed using one of the following methods: ? Radiofrequency. This method uses a radiofrequency-alternating electric current to remove the endometrium. ? Cryotherapy. This method uses extreme cold to freeze the endometrium. ? Heated-free liquid. This method uses a heated saltwater (saline) solution to remove the endometrium. ? Microwave. This method uses high-energy microwaves to heat up the endometrium and remove it. ? Thermal balloon. This method involves inserting a catheter with a balloon tip into the uterus. The balloon tip is filled with   heated fluid to remove the endometrium. The procedure may vary among health care providers and hospitals. What happens after the procedure?  Your blood pressure, heart rate, breathing rate, and blood oxygen level will be monitored until the medicines you were given have worn off.  As tissue healing occurs, you may notice  vaginal bleeding for 4-6 weeks after the procedure. You may also experience: ? Cramps. ? Thin, watery vaginal discharge that is light pink or brown in color. ? A need to urinate more frequently than usual. ? Nausea.  Do not drive for 24 hours if you were given a sedative.  Do not have sex or insert anything into your vagina until your health care provider approves. Summary  Endometrial ablation is done to treat the many causes of heavy menstrual bleeding.  The procedure may be done only after medications have been tried to control the bleeding.  Plan to have someone take you home from the hospital or clinic. This information is not intended to replace advice given to you by your health care provider. Make sure you discuss any questions you have with your health care provider. Document Revised: 01/06/2018 Document Reviewed: 08/08/2016 Elsevier Patient Education  [place]. Abnormal Uterine Bleeding Abnormal uterine bleeding is unusual bleeding from the uterus. It includes:  Bleeding or spotting between periods.  Bleeding after sex.  Bleeding that is heavier than normal.  Periods that last longer than usual.  Bleeding after menopause. Abnormal uterine bleeding can affect women at various stages in life, including teenagers, women in their reproductive years, pregnant women, and women who have reached menopause. Common causes of abnormal uterine bleeding include:  Pregnancy.  Growths of tissue (polyps).  A noncancerous tumor in the uterus (fibroid).  Infection.  Cancer.  Hormonal imbalances. Any type of abnormal bleeding should be evaluated by a health care provider. Many cases are minor and simple to treat, while others are more serious. Treatment will depend on the cause of the bleeding. Follow these instructions at home:  Monitor your condition for any changes.  Do not use tampons, douche, or have sex if told by your health care provider.  Change your pads  often.  Get regular exams that include pelvic exams and cervical cancer screening.  Keep all follow-up visits as told by your health care provider. This is important. Contact a health care provider if:  Your bleeding lasts for more than one week.  You feel dizzy at times.  You feel nauseous or you vomit. Get help right away if:  You pass out.  Your bleeding soaks through a pad every hour.  You have abdominal pain.  You have a fever.  You become sweaty or weak.  You pass large blood clots from your vagina. Summary  Abnormal uterine bleeding is unusual bleeding from the uterus.  Any type of abnormal bleeding should be evaluated by a health care provider. Many cases are minor and simple to treat, while others are more serious.  Treatment will depend on the cause of the bleeding. This information is not intended to replace advice given to you by your health care provider. Make sure you discuss any questions you have with your health care provider. Document Revised: 10/29/2017 Document Reviewed: 08/23/2016 Elsevier Patient Education  [place]. Menorrhagia Menorrhagia is when your menstrual periods are heavy or last longer than normal. Follow these instructions at home: Medicines   Take over-the-counter and prescription medicines exactly as told by your doctor. This includes iron pills.  Do not change or switch medicines without asking your doctor.  Do not take aspirin or medicines that contain aspirin 1 week before or during your period. Aspirin may make bleeding worse. General instructions  If you need to change your pad or tampon more than once every 2 hours, limit your activity until the bleeding stops.  Iron pills can cause problems when pooping (constipation). To prevent or treat pooping problems while taking prescription iron pills, your doctor may suggest that you: ? Drink enough fluid to keep your pee (urine) clear or pale yellow. ? Take  over-the-counter or prescription medicines. ? Eat foods that are high in fiber. These foods include:  Fresh fruits and vegetables.  Whole grains.  Beans. ? Limit foods that are high in fat and processed sugars. This includes fried and sweet foods.  Eat healthy meals and foods that are high in iron. Foods that have a lot of iron include: ? Leafy green vegetables. ? Meat. ? Liver. ? Eggs. ? Whole grain breads and cereals.  Do not try to lose weight until your heavy bleeding has stopped and you have normal amounts of iron in your blood. If you need to lose weight, work with your doctor.  Keep all follow-up visits as told by your doctor. This is important. Contact a doctor if:  You soak through a pad or tampon every 1 or 2 hours, and this happens every time you have a period.  You need to use pads and tampons at the same time because you are bleeding so much.  You are taking medicine and you: ? Feel sick to your stomach (nauseous). ? Throw up (vomit). ? Have watery poop (diarrhea).  You have other problems that may be related to the medicine you are taking. Get help right away if:  You soak through more than a pad or tampon in 1 hour.  You pass clots bigger than 1 inch (2.5 cm) wide.  You feel short of breath.  You feel like your heart is beating too fast.  You feel dizzy or you pass out (faint).  You feel very weak or tired. Summary  Menorrhagia is when your menstrual periods are heavy or last longer than normal.  Take over-the-counter and prescription medicines exactly as told by your doctor. This includes iron pills.  Contact a doctor if you soak through more than a pad or tampon in 1 hour or are passing large clots. This information is not intended to replace advice given to you by your health care provider. Make sure you discuss any questions you have with your health care provider. Document Revised: 10/29/2017 Document Reviewed: 08/12/2016 Elsevier Patient  Education  [place]. Intrauterine Device Information An intrauterine device (IUD) is a medical device that is inserted in the uterus to prevent pregnancy. It is a small, T-shaped device that has one or two nylon strings hanging down from it. The strings hang out of the lower part of the uterus (cervix) to allow for future IUD removal. There are two types of IUDs available:  Hormone IUD. This type of IUD is made of plastic and contains the hormone progestin (synthetic progesterone). A hormone IUD may last 3-5 years.  Copper IUD. This type of IUD has copper wire wrapped around it. A copper IUD may last up to 10 years. How is an IUD inserted? An IUD is inserted through the vagina and placed into the uterus with a minor medical procedure. The exact procedure for IUD insertion may vary  among health care providers and hospitals. How does an IUD work? Synthetic progesterone in a hormonal IUD prevents pregnancy by:  Thickening cervical mucus to prevent sperm from entering the uterus.  Thinning the uterine lining to prevent a fertilized egg from being implanted there. Copper in a copper IUD prevents pregnancy by making the uterus and fallopian tubes produce a fluid that kills sperm. What are the advantages of an IUD? Advantages of either type of IUD  It is highly effective in preventing pregnancy.  It is reversible. You can become pregnant shortly after the IUD is removed.  It is low-maintenance and can stay in place for a long time.  There are no estrogen-related side effects.  It can be used when breastfeeding.  It is not associated with weight gain.  It can be inserted right after childbirth, an abortion, or a miscarriage. Advantages of a hormone IUD  If it is inserted within 7 days of your period starting, it works right after it is inserted. If the hormone IUD is inserted at any other time in your cycle, you will need to use a backup method of birth control for 7 days after  insertion.  It can make menstrual periods lighter.  It can reduce menstrual cramping.  It can be used for 3-5 years. Advantages of a copper IUD  It works right after it is inserted.  It can be used as a form of emergency birth control if it is inserted within 5 days after having unprotected sex.  It does not interfere with your body's natural hormones.  It can be used for 10 years. What are the disadvantages of an IUD?  An IUD may cause irregular menstrual bleeding for a period of time after insertion.  You may have pain during insertion and have cramping and vaginal bleeding after insertion.  An IUD may cut the uterus (uterine perforation) when it is inserted. This is rare.  An IUD may cause pelvic inflammatory disease (PID), which is an infection in the uterus and fallopian tubes. This is rare, and it usually happens during the first 20 days after the IUD is inserted.  A copper IUD can make your menstrual flow heavier and more painful. How is an IUD removed?  You will lie on your back with your knees bent and your feet in footrests (stirrups).  A device will be inserted into your vagina to spread apart the vaginal walls (speculum). This will allow your health care provider to see the strings attached to the IUD.  Your health care provider will use a small instrument (forceps) to grasp the IUD strings and pull firmly until the IUD is removed. You may have some discomfort when the IUD is removed. Your health care provider may recommend taking over-the-counter pain relievers, such as ibuprofen, before the procedure. You may also have minor spotting for a few days after the procedure. The exact procedure for IUD removal may vary among health care providers and hospitals. Is the IUD right for me? Your health care provider will make sure you are a good candidate for an IUD and will discuss the advantages, disadvantages, and possible side effects with you. Summary  An intrauterine  device (IUD) is a medical device that is inserted in the uterus to prevent pregnancy. It is a small, T-shaped device that has one or two nylon strings hanging down from it.  A hormone IUD contains the hormone progestin (synthetic progesterone). A copper IUD has copper wire wrapped around it.  Synthetic  progesterone in a hormone IUD prevents pregnancy by thickening cervical mucus and thinning the walls of the uterus. Copper in a copper IUD prevents pregnancy by making the uterus and fallopian tubes produce a fluid that kills sperm.  A hormone IUD can be left in place for 3-5 years. A copper IUD can be left in place for up to 10 years.  An IUD is inserted and removed by a health care provider. You may feel some pain during insertion and removal. Your health care provider may recommend taking over-the-counter pain medicine, such as ibuprofen, before an IUD procedure. This information is not intended to replace advice given to you by your health care provider. Make sure you discuss any questions you have with your health care provider. Document Revised: 07/04/2017 Document Reviewed: 08/20/2016 Elsevier Patient Education  [place].

## 2020-07-13 LAB — CBC
Hematocrit: 36.5 % (ref 34.0–46.6)
Hemoglobin: 11.9 g/dL (ref 11.1–15.9)
MCH: 30.7 pg (ref 26.6–33.0)
MCHC: 32.6 g/dL (ref 31.5–35.7)
MCV: 94 fL (ref 79–97)
Platelets: 374 10*3/uL (ref 150–450)
RBC: 3.87 x10E6/uL (ref 3.77–5.28)
RDW: 11.5 % — ABNORMAL LOW (ref 11.7–15.4)
WBC: 5.9 10*3/uL (ref 3.4–10.8)

## 2020-07-13 LAB — FSH/LH
FSH: 8.2 m[IU]/mL
LH: 11.7 m[IU]/mL

## 2020-07-21 ENCOUNTER — Other Ambulatory Visit: Payer: Self-pay | Admitting: Advanced Practice Midwife

## 2020-07-21 ENCOUNTER — Ambulatory Visit (INDEPENDENT_AMBULATORY_CARE_PROVIDER_SITE_OTHER): Payer: No Typology Code available for payment source | Admitting: Advanced Practice Midwife

## 2020-07-21 ENCOUNTER — Other Ambulatory Visit: Payer: Self-pay

## 2020-07-21 ENCOUNTER — Ambulatory Visit (INDEPENDENT_AMBULATORY_CARE_PROVIDER_SITE_OTHER): Payer: No Typology Code available for payment source

## 2020-07-21 ENCOUNTER — Encounter: Payer: Self-pay | Admitting: Advanced Practice Midwife

## 2020-07-21 VITALS — BP 120/80 | Ht 64.0 in | Wt 124.0 lb

## 2020-07-21 DIAGNOSIS — N924 Excessive bleeding in the premenopausal period: Secondary | ICD-10-CM

## 2020-07-21 DIAGNOSIS — Z09 Encounter for follow-up examination after completed treatment for conditions other than malignant neoplasm: Secondary | ICD-10-CM | POA: Diagnosis not present

## 2020-07-21 NOTE — Patient Instructions (Signed)
Myomectomy    Myomectomy is a surgery in which a non-cancerous fibroid (myoma) is removed from the uterus. Myomas are tumors made up of fibrous tissue. They are often called fibroid tumors. Fibroid tumors can range from the size of a pea to the size of a grapefruit. In a myomectomy, the fibroid tumor is removed without removing the uterus. Because these tumors are rarely cancerous, this surgery is usually done only if the tumor is growing or causing symptoms such as pain, pressure, bleeding, or pain with intercourse.  Tell a health care provider about:  · Any allergies you have.  · All medicines you are taking, including vitamins, herbs, eye drops, creams, and over-the-counter medicines.  · Any problems you or family members have had with anesthetic medicines.  · Any blood disorders you have.  · Any surgeries you have had.  · Any medical conditions you have.  What are the risks?  Generally, this is a safe procedure. However, problems may occur, including:  · Bleeding.  · Infection.  · Allergic reactions to medicines.  · Damage to other structures or organs.  · Blood clots in the legs, chest, or brain.  · Scar tissue on other organs and in the pelvis. This may require another surgery to remove the scar tissue.  What happens before the procedure?  Staying hydrated  Follow instructions from your health care provider about hydration, which may include:  · Up to 2 hours before the procedure - you may continue to drink clear liquids, such as water, clear fruit juice, black coffee, and plain tea.  Eating and drinking restrictions  Follow instructions from your health care provider about eating and drinking, which may include:  · 8 hours before the procedure - stop eating heavy meals or foods such as meat, fried foods, or fatty foods.  · 6 hours before the procedure - stop eating light meals or foods, such as toast or cereal.  · 6 hours before the procedure - stop drinking milk or drinks that contain milk.  · 2 hours before  the procedure - stop drinking clear liquids.  General instructions  · Ask your health care provider about:  ? Changing or stopping your regular medicines. This is especially important if you are taking diabetes medicines or blood thinners.  ? Taking medicines such as aspirin and ibuprofen. These medicines can thin your blood. Do not take these medicines before your procedure if your health care provider instructs you not to.  · Do not drink alcohol the day before the surgery.  · Do not use any products that contain nicotine or tobacco, such as cigarettes and e-cigarettes, for 2 weeks before the procedure. If you need help quitting, ask your health care provider.  · Plan to have someone take you home from the hospital or clinic. Also arrange for someone to help you with activities during your recovery.  What happens during the procedure?  · To reduce your risk of infection:  ? Your health care team will wash or sanitize their hands.  ? Your skin will be washed with soap.  ? Hair may be removed from the surgical area.  · An IV tube will be inserted into one of your veins. Medicines will be able to flow directly into your body through this IV tube.  · You will be given one or more of the following:  ? A medicine to help you relax (sedative).  ? A medicine to make you fall asleep (general anesthetic).  ·   into your bladder to collect urine.  Your surgeon will use one of the following methods to perform the procedure. The method used will depend on the size, shape, location, and number of fibroids. Hysteroscopic myomectomy This method may be used when the fibroid tumor is inside the cavity of the uterus. A long, thin tube with a lens (hysteroscope) will be inserted into the  uterus through the vagina. A saline solution will be put into the uterus. This will expand the uterus and allow the surgeon to see the fibroids. Tools will be passed through the hysteroscope to remove the fibroid tumor in pieces. Laparoscopic myomectomy A few small incisions will be made in the lower abdomen. A thin, lighted tube with a camera (laparoscope) will be inserted through one of the incisions. This will give the surgeon a good view of the area. The fibroid tumor will be removed through the other incisions. The incisions will then be closed with stitches (sutures) or staples. Abdominal myomectomy This method is used when the fibroid tumor cannot be removed with a hysteroscope or laparoscope. The surgery will be done through a larger surgical incision in the abdomen. The fibroid tumor will be removed through this incision. The incision will be closed with sutures or staples. Recovery time will be longer if this method is used. The procedures may vary among health care providers and hospitals. What happens after the procedure?  Your blood pressure, heart rate, breathing rate, and blood oxygen level will be monitored until the medicines you were given have worn off.  The IV access tube and catheter will remain on your body for a period of time.  You may be given medicine for pain or to help you sleep.  You may be given an antibiotic medicine if needed.  Do not drive for 24 hours if you were given a sedative. Summary  Myomectomy is surgery to remove a noncancerous fibroid (myoma) from the uterus.  This surgery is usually done only if the tumor is growing or causing symptoms such as pain, pressure, bleeding, or pain during intercourse.  Follow instructions from your health care provider about eating and drinking before the procedure.  Recovery time from this procedure depends on the method. The abdominal method will require a longer recovery time. This information is not intended to  replace advice given to you by your health care provider. Make sure you discuss any questions you have with your health care provider. Document Revised: 11/13/2018 Document Reviewed: 08/22/2016 Elsevier Patient Education  [place]. Uterine Fibroids  Uterine fibroids (leiomyomas) are noncancerous (benign) tumors that can develop in the uterus. Fibroids may also develop in the fallopian tubes, cervix, or tissues (ligaments) near the uterus. You may have one or many fibroids. Fibroids vary in size, weight, and where they grow in the uterus. Some can become quite large. Most fibroids do not require medical treatment. What are the causes? The cause of this condition is not known. What increases the risk? You are more likely to develop this condition if you:  Are in your 30s or 40s and have not gone through menopause.  Have a family history of this condition.  Are of African-American descent.  Had your first period at an early age (early menarche).  Have not had any children (nulliparity).  Are overweight or obese. What are the signs or symptoms? Many women do not have any symptoms. Symptoms of this condition may include:  Heavy menstrual bleeding.  Bleeding or spotting between periods.  Pain and pressure in the pelvic area, between the hips.  Bladder problems, such as needing to urinate urgently or more often than usual.  Inability to have children (infertility).  Failure to carry pregnancy to term (miscarriage). How is this diagnosed? This condition may be diagnosed based on:  Your symptoms and medical history.  A physical exam.  A pelvic exam that includes feeling for any tumors.  Imaging tests, such as ultrasound or MRI. How is this treated? Treatment for this condition may include:  Seeing your health care provider for follow-up visits to monitor your fibroids for any changes.  Taking NSAIDs such as ibuprofen, naproxen, or aspirin to reduce  pain.  Hormone medicines. These may be taken as a pill, given in an injection, or delivered by a T-shaped device that is inserted into the uterus (intrauterine device, IUD).  Surgery to remove one of the following: ? The fibroids (myomectomy). Your health care provider may recommend this if fibroids affect your fertility and you want to become pregnant. ? The uterus (hysterectomy). ? Blood supply to the fibroids (uterine artery embolization). Follow these instructions at home:  Take over-the-counter and prescription medicines only as told by your health care provider.  Ask your health care provider if you should take iron pills or eat more iron-rich foods, such as dark green, leafy vegetables. Heavy menstrual bleeding can cause low iron levels.  If directed, apply heat to your back or abdomen to reduce pain. Use the heat source that your health care provider recommends, such as a moist heat pack or a heating pad. ? Place a towel between your skin and the heat source. ? Leave the heat on for 20-30 minutes. ? Remove the heat if your skin turns bright red. This is especially important if you are unable to feel pain, heat, or cold. You may have a greater risk of getting burned.  Pay close attention to your menstrual cycle. Tell your health care provider about any changes, such as: ? Increased blood flow that requires you to use more pads or tampons than usual. ? A change in the number of days that your period lasts. ? A change in symptoms that are associated with your period, such as back pain or cramps in your abdomen.  Keep all follow-up visits as told by your health care provider. This is important, especially if your fibroids need to be monitored for any changes. Contact a health care provider if you:  Have pelvic pain, back pain, or cramps in your abdomen that do not get better with medicine or heat.  Develop new bleeding between periods.  Have increased bleeding during or between  periods.  Feel unusually tired or weak.  Feel light-headed. Get help right away if you:  Faint.  Have pelvic pain that suddenly gets worse.  Have severe vaginal bleeding that soaks a tampon or pad in 30 minutes or less. Summary  Uterine fibroids are noncancerous (benign) tumors that can develop in the uterus.  The exact cause of this condition is not known.  Most fibroids do not require medical treatment unless they affect your ability to have children (fertility).  Contact a health care provider if you have pelvic pain, back pain, or cramps in your abdomen that do not get better with medicines.  Make sure you know what symptoms should cause you to get help right away. This information is not intended to replace advice given to you by your health care provider. Make sure you discuss any questions  you have with your health care provider. Document Revised: 07/04/2017 Document Reviewed: 06/17/2017 Elsevier Patient Education  2020 [employer].

## 2020-07-21 NOTE — Progress Notes (Signed)
Patient ID: Leah Moses, female   DOB: 06-Jul-1976, 44 y.o.   MRN: [id number]  Reason for Visit: Gyn ultrasound/follow up  Subjective:  HPI:  Leah Moses is a 44 y.o. female being seen for follow up visit. She has had increased vaginal bleeding. Ultrasound today reveals multiple fibroids. We discussed treatment options. She plans to follow up with MD regarding procedures.  Past Medical History:  Diagnosis Date  . Dysmenorrhea   . Melanoma ([place])    Chest, leg, shoulder, back, ankle   History reviewed. No pertinent family history. Past Surgical History:  Procedure Laterality Date  . MELANOMA EXCISION     Multiple sites/dates    Short Social History:  Social History   Tobacco Use  . Smoking status: Never Smoker  . Smokeless tobacco: Never Used  Substance Use Topics  . Alcohol use: Yes    Comment: 1-2/month    No Known Allergies  Current Outpatient Medications  Medication Sig Dispense Refill  . norethindrone-ethinyl estradiol (JUNEL FE 1/20) 1-20 MG-MCG tablet Take 1 tablet by mouth daily. 84 tablet 4   No current facility-administered medications for this visit.    Review of Systems  Constitutional: Negative for chills and fever.  HENT: Negative for congestion, ear discharge, ear pain, hearing loss, sinus pain and sore throat.   Eyes: Negative for blurred vision and double vision.  Respiratory: Negative for cough, shortness of breath and wheezing.   Cardiovascular: Negative for chest pain, palpitations and leg swelling.  Gastrointestinal: Negative for abdominal pain, blood in stool, constipation, diarrhea, heartburn, melena, nausea and vomiting.  Genitourinary: Negative for dysuria, flank pain, frequency, hematuria and urgency.       Positive for continued vaginal bleeding  Musculoskeletal: Negative for back pain, joint pain and myalgias.  Skin: Negative for itching and rash.  Neurological: Negative for dizziness, tingling, tremors, sensory change, speech  change, focal weakness, seizures, loss of consciousness, weakness and headaches.  Endo/Heme/Allergies: Negative for environmental allergies. Does not bruise/bleed easily.  Psychiatric/Behavioral: Negative for depression, hallucinations, memory loss, substance abuse and suicidal ideas. The patient is not nervous/anxious and does not have insomnia.         Objective:  Objective   Vitals:   07/21/20 1544  BP: 120/80  Weight: 124 lb (56.2 kg)  Height: 5\' 4"  (1.626 m)   Body mass index is 21.28 kg/m. Constitutional: Well nourished, well developed female in no acute distress.  HEENT: normal Skin: Warm and dry.  Respiratory: Normal respiratory effort Neuro: DTRs 2+, Cranial nerves grossly intact Psych: Alert and Oriented x3. No memory deficits. Normal mood and affect.  MS: normal gait, normal bilateral lower extremity ROM/strength/stability.  Data: Patient Name: Leah Moses DOB: [birth year] MRN: [id number]   ULTRASOUND REPORT  Location: [facility]  Date of Service: 07/21/2020   Indications:Abnormal Uterine Bleeding   Findings:  The uterus is anteverted and measures 10.8 x 8.3 x 6.4 cm. Echo texture is heterogenous with evidence of focal masses. Within the uterus are multiple suspected fibroids measuring: Fibroid 1: 34.5 x 28.1 x 35.4 mm less than 50% submucosal, right. It is partially degenerating. Fibroid 2:16.8 x 17.8 x 15.0 mm intramural right Fibroid 3: 50.7 x 36.4 x 37.1 mm intramural, fundal left Fibroid 4: 13.7 x 8.7 x 12.3 mm intramural anterior Fibroid 5: 12.8 x 11.1 x 18.0 mm intramural posterior right  The Endometrium measures 4.7 mm. The endometrial lining is partially obscured by a submucosal fibroid.   Right Ovary measures 3.1 x 2.6 x 2.0  cm. It is normal in appearance. Left Ovary measures 4.0 x 2.2 x 2.1 cm. It is normal in appearance. Survey of the adnexa demonstrates no adnexal masses. There is no free fluid in the cul de  sac.  Impression: 1. The endometrium is partially obscured by a less than 50% submucosal, degenerating fibroid.  2. There are five uterine fibroids seen.  3. Normal appearing cervix and ovaries.   Recommendations: 1.Clinical correlation with the patient's History and Physical Exam.  Gweneth Dimitri, RT  Assessment/Plan:     44 y.o. G2 P24 female with multiple fibroids seen on gyn ultrasound  Follow up with MD to discuss treatment options for fibroids   [facility] 07/21/2020, 5:13 PM

## 2020-07-26 ENCOUNTER — Encounter: Payer: Self-pay | Admitting: Obstetrics and Gynecology

## 2020-07-26 ENCOUNTER — Other Ambulatory Visit: Payer: Self-pay

## 2020-07-26 ENCOUNTER — Ambulatory Visit (INDEPENDENT_AMBULATORY_CARE_PROVIDER_SITE_OTHER): Payer: No Typology Code available for payment source | Admitting: Obstetrics and Gynecology

## 2020-07-26 VITALS — BP 112/70 | Ht 64.0 in | Wt 126.4 lb

## 2020-07-26 DIAGNOSIS — D251 Intramural leiomyoma of uterus: Secondary | ICD-10-CM

## 2020-07-26 NOTE — Progress Notes (Signed)
Patient ID: Leah Moses, female   DOB: 03/23/1976, 44 y.o.   MRN: [id number]  Reason for Consult: Gynecologic Exam   Referred by No ref. provider found  Subjective:     HPI:  Leah Moses is a 44 y.o. female.   She reports that she has been having extended bleeding for at least last year.  She reports that she is also had increased pelvic pain and pressure.  She reports that she has been having accidents where she bleeds through her clothes.  She reports that she has been having flooding and gushing sensations of blood.  She is to change her pad more frequently than every hour.  She reports she has had prolonged periods of bleeding which will last for 3 to 4 weeks.  She reports that she recently tried management of her periods with a oral birth control pill and has done so for the last 3 months.  She has not had any improvement though in her bleeding pattern.  Recent pelvic ultrasound showed multiple enlarged intramural fibroids within her uterus.    Gynecological History Last pap smear: 02/13/2018 NIL Last Mammogram: ordered Sexually Active: yes  Obstetrical History OB History  Gravida Para Term Preterm AB Living  2 1 1   1 1   SAB IAB Ectopic Multiple Live Births  1       1    # Outcome Date GA Lbr Len/2nd Weight Sex Delivery Anes PTL Lv  2 Term 07/11/01   7 lb 8 oz (3.402 kg) F Vag-Spont   LIV  1 SAB              Past Medical History:  Diagnosis Date  . Dysmenorrhea   . Melanoma ([place])    Chest, leg, shoulder, back, ankle   History reviewed. No pertinent family history. Past Surgical History:  Procedure Laterality Date  . MELANOMA EXCISION     Multiple sites/dates    Short Social History:  Social History   Tobacco Use  . Smoking status: Never Smoker  . Smokeless tobacco: Never Used  Substance Use Topics  . Alcohol use: Yes    Comment: 1-2/month    No Known Allergies  Current Outpatient Medications  Medication Sig Dispense Refill  .  norethindrone-ethinyl estradiol (JUNEL FE 1/20) 1-20 MG-MCG tablet Take 1 tablet by mouth daily. 84 tablet 4   No current facility-administered medications for this visit.    Review of Systems  Constitutional: Negative for chills, fatigue, fever and unexpected weight change.  HENT: Negative for trouble swallowing.  Eyes: Negative for loss of vision.  Respiratory: Negative for cough, shortness of breath and wheezing.  Cardiovascular: Negative for chest pain, leg swelling, palpitations and syncope.  GI: Negative for abdominal pain, blood in stool, diarrhea, nausea and vomiting.  GU: Negative for difficulty urinating, dysuria, frequency and hematuria.  Musculoskeletal: Negative for back pain, leg pain and joint pain.  Skin: Negative for rash.  Neurological: Negative for dizziness, headaches, light-headedness, numbness and seizures.  Psychiatric: Negative for behavioral problem, confusion, depressed mood and sleep disturbance.        Objective:  Objective   Vitals:   07/26/20 0809  BP: 112/70  Weight: 126 lb 6.4 oz (57.3 kg)  Height: 5\' 4"  (1.626 m)   Body mass index is 21.7 kg/m.  Physical Exam Vitals and nursing note reviewed.  Constitutional:      Appearance: She is well-developed and well-nourished.  HENT:     Head: Normocephalic and atraumatic.  Eyes:  Extraocular Movements: EOM normal.     Pupils: Pupils are equal, round, and reactive to light.  Cardiovascular:     Rate and Rhythm: Normal rate and regular rhythm.  Pulmonary:     Effort: Pulmonary effort is normal. No respiratory distress.  Skin:    General: Skin is warm and dry.  Neurological:     Mental Status: She is alert and oriented to person, place, and time.  Psychiatric:        Mood and Affect: Mood and affect normal.        Behavior: Behavior normal.        Thought Content: Thought content normal.        Judgment: Judgment normal.     Assessment/Plan:     44 y.o. G2P1011 with complaints of  abnormal uterine bleeding secondary to uterine fibroids.   We discussed options for management of uterine fibroids.  We discussed that this can include hormonal medications such as an oral contraceptive pill, progesterone only pill, Depo-Provera, Nexplanon,  progesterone IUD.  We discussed surgical options including hysteroscopy with myomectomy for small submucosal fibroids, abdominal or laparoscopic myomectomy, uterine fibroid embolization, and hysterectomy.  We discussed the pros and cons of these different procedures and medical management options in detail.   We reviewed the options in detail and elected that a hysterectomy would best suit her needs. If hysterectomy is planned with return for pelvic exam and EMB.    More than 30 minutes were spent face to face with the patient in the room, reviewing the medical record, labs and images, and coordinating care for the patient. The plan of management was discussed in detail and counseling was provided.   Adrian Prows MD [facility], [facility] 07/26/2020 8:48 AM

## 2020-07-26 NOTE — Patient Instructions (Addendum)
Uterine Fibroids  Uterine fibroids (leiomyomas) are noncancerous (benign) tumors that can develop in the uterus. Fibroids may also develop in the fallopian tubes, cervix, or tissues (ligaments) near the uterus. You may have one or many fibroids. Fibroids vary in size, weight, and where they grow in the uterus. Some can become quite large. Most fibroids do not require medical treatment. What are the causes? The cause of this condition is not known. What increases the risk? You are more likely to develop this condition if you:  Are in your 30s or 40s and have not gone through menopause.  Have a family history of this condition.  Are of African-American descent.  Had your first period at an early age (early menarche).  Have not had any children (nulliparity).  Are overweight or obese. What are the signs or symptoms? Many women do not have any symptoms. Symptoms of this condition may include:  Heavy menstrual bleeding.  Bleeding or spotting between periods.  Pain and pressure in the pelvic area, between the hips.  Bladder problems, such as needing to urinate urgently or more often than usual.  Inability to have children (infertility).  Failure to carry pregnancy to term (miscarriage). How is this diagnosed? This condition may be diagnosed based on:  Your symptoms and medical history.  A physical exam. A pelvic exam that includes  Total Laparoscopic Hysterectomy A total laparoscopic hysterectomy is a minimally invasive surgery to remove the uterus and cervix. The fallopian tubes and ovaries can also be removed (bilateral salpingo-oophorectomy) during this surgery, if necessary. This procedure may be done to treat problems such as:  Noncancerous growths in the uterus (uterine fibroids) that cause symptoms.  A condition that causes the lining of the uterus (endometrium) to grow in other areas (endometriosis).  Problems with pelvic support. This is caused by weakened muscles  of the pelvis following vaginal childbirth or menopause.  Cancer of the cervix, ovaries, uterus, or endometrium.  Excessive (dysfunctional) uterine bleeding. This surgery is performed by inserting a thin, lighted tube (laparoscope) and surgical instruments into small incisions in the abdomen. The laparoscope sends images to a monitor. The images help the health care provider perform the procedure. After this procedure, you will no longer be able to have a baby, and you will no longer have a menstrual period. Tell a health care provider about:  Any allergies you have.  All medicines you are taking, including vitamins, herbs, eye drops, creams, and over-the-counter medicines.  Any problems you or family members have had with anesthetic medicines.  Any blood disorders you have.  Any surgeries you have had.  Any medical conditions you have.  Whether you are pregnant or may be pregnant. What are the risks? Generally, this is a safe procedure. However, problems may occur, including:  Infection.  Bleeding.  Blood clots in the legs or lungs.  Allergic reactions to medicines.  Damage to other structures or organs.  The risk that the surgery may have to be switched to the regular one in which a large incision is made in the abdomen (abdominal hysterectomy). What happens before the procedure? Staying hydrated Follow instructions from your health care provider about hydration, which may include:  Up to 2 hours before the procedure - you may continue to drink clear liquids, such as water, clear fruit juice, black coffee, and plain tea Eating and drinking restrictions Follow instructions from your health care provider about eating and drinking, which may include:  8 hours before the procedure - stop  eating heavy meals or foods such as meat, fried foods, or fatty foods.  6 hours before the procedure - stop eating light meals or foods, such as toast or cereal.  6 hours before the  procedure - stop drinking milk or drinks that contain milk.  2 hours before the procedure - stop drinking clear liquids. Medicines  Ask your health care provider about: ? Changing or stopping your regular medicines. This is especially important if you are taking diabetes medicines or blood thinners. ? Taking over-the-counter medicines, vitamins, herbs, and supplements. ? Taking medicines such as aspirin and ibuprofen. These medicines can thin your blood. Do not take these medicines unless your health care provider tells you to take them.  You may be given antibiotic medicine to help prevent infection.  You may be asked to take laxatives.  You may be given medicines to help prevent nausea and vomiting after the procedure. General instructions  Ask your health care provider how your surgical site will be marked or identified.  You may be asked to shower with a germ-killing soap.  Do not use any products that contain nicotine or tobacco, such as cigarettes and e-cigarettes. If you need help quitting, ask your health care provider.  You may have an exam or testing, such as an ultrasound to determine the size and shape of your pelvic organs.  You may have a blood or urine sample taken.  This procedure can affect the way you feel about yourself. Talk with your health care provider about the physical and emotional changes hysterectomy may cause.  Plan to have someone take you home from the hospital or clinic.  Plan to have a responsible adult care for you for at least 24 hours after you leave the hospital or clinic. This is important. What happens during the procedure?  To lower your risk of infection: ? Your health care team will wash or sanitize their hands. ? Your skin will be washed with soap. ? Hair may be removed from the surgical area.  An IV will be inserted into one of your veins.  You will be given one or more of the following: ? A medicine to help you relax  (sedative). ? A medicine to make you fall asleep (general anesthetic).  You will be given antibiotic medicine through your IV.  A tube may be inserted down your throat to help you breathe during the procedure.  A gas (carbon dioxide) will be used to inflate your abdomen to allow your surgeon to see inside of your abdomen.  Three or four small incisions will be made in your abdomen.  A laparoscope will be inserted into one of your incisions. Surgical instruments will be inserted through the other incisions in order to perform the procedure.  Your uterus and cervix may be removed through your vagina or cut into small pieces and removed through the small incisions. Any other organs that need to be removed will also be removed this way.  Carbon dioxide will be released from inside of your abdomen.  Your incisions will be closed with stitches (sutures).  A bandage (dressing) may be placed over your incisions. The procedure may vary among health care providers and hospitals. What happens after the procedure?  Your blood pressure, heart rate, breathing rate, and blood oxygen level will be monitored until the medicines you were given have worn off.  You will be given medicine for pain and nausea as needed.  Do not drive for 24 hours if you  received a sedative. Summary  Total Laparoscopic hysterectomy is a procedure to remove your uterus, cervix and sometimes the fallopian tubes and ovaries.  This procedure can affect the way you feel about yourself. Talk with your health care provider about the physical and emotional changes hysterectomy may cause.  After this procedure, you will no longer be able to have a baby, and you will no longer have a menstrual period.  You will be given pain medicine to control discomfort after this procedure. This information is not intended to replace advice given to you by your health care provider. Make sure you discuss any questions you have with your  health care provider. Document Revised: 07/04/2017 Document Reviewed: 10/02/2016 Elsevier Patient Education  2020 [employer].  feeling for any tumors.  Imaging tests, such as ultrasound or MRI. How is this treated? Treatment for this condition may include:  Seeing your health care provider for follow-up visits to monitor your fibroids for any changes.  Taking NSAIDs such as ibuprofen, naproxen, or aspirin to reduce pain.  Hormone medicines. These may be taken as a pill, given in an injection, or delivered by a T-shaped device that is inserted into the uterus (intrauterine device, IUD).  Surgery to remove one of the following: ? The fibroids (myomectomy). Your health care provider may recommend this if fibroids affect your fertility and you want to become pregnant. ? The uterus (hysterectomy). ? Blood supply to the fibroids (uterine artery embolization). Follow these instructions at home:  Take over-the-counter and prescription medicines only as told by your health care provider.  Ask your health care provider if you should take iron pills or eat more iron-rich foods, such as dark green, leafy vegetables. Heavy menstrual bleeding can cause low iron levels.  If directed, apply heat to your back or abdomen to reduce pain. Use the heat source that your health care provider recommends, such as a moist heat pack or a heating pad. ? Place a towel between your skin and the heat source. ? Leave the heat on for 20-30 minutes. ? Remove the heat if your skin turns bright red. This is especially important if you are unable to feel pain, heat, or cold. You may have a greater risk of getting burned.  Pay close attention to your menstrual cycle. Tell your health care provider about any changes, such as: ? Increased blood flow that requires you to use more pads or tampons than usual. ? A change in the number of days that your period lasts. ? A change in symptoms that are associated with your  period, such as back pain or cramps in your abdomen.  Keep all follow-up visits as told by your health care provider. This is important, especially if your fibroids need to be monitored for any changes. Contact a health care provider if you:  Have pelvic pain, back pain, or cramps in your abdomen that do not get better with medicine or heat.  Develop new bleeding between periods.  Have increased bleeding during or between periods.  Feel unusually tired or weak.  Feel light-headed. Get help right away if you:  Faint.  Have pelvic pain that suddenly gets worse.  Have severe vaginal bleeding that soaks a tampon or pad in 30 minutes or less. Summary  Uterine fibroids are noncancerous (benign) tumors that can develop in the uterus.  The exact cause of this condition is not known.  Most fibroids do not require medical treatment unless they affect your ability to have children (  fertility).  Contact a health care provider if you have pelvic pain, back pain, or cramps in your abdomen that do not get better with medicines.  Make sure you know what symptoms should cause you to get help right away. This information is not intended to replace advice given to you by your health care provider. Make sure you discuss any questions you have with your health care provider. Document Revised: 07/04/2017 Document Reviewed: 06/17/2017 Elsevier Patient Education  2020 [employer].

## 2020-07-26 NOTE — Progress Notes (Signed)
discuss treatment of fibroids/bleeding. Pt had a Vaginal [place] her last visit.

## 2020-08-03 ENCOUNTER — Telehealth: Payer: Self-pay | Admitting: Obstetrics and Gynecology

## 2020-08-03 NOTE — Telephone Encounter (Signed)
-----   Message from Natale Milch, MD sent at 07/26/2020  8:48 AM EST ----- Surgery Booking Request Patient Full Name:  Leah Moses  MRN: [id number]  DOB: 09-25-1975  Surgeon: Natale Milch, MD  Requested Surgery Date and Time: 2022- maybe 1/27?  Primary Diagnosis AND Code: Enlarged fibroid uterus Secondary Diagnosis and Code:  Surgical Procedure: RAH/BS RNFA Requested?: Yes L&D Notification: No Admission Status: same day surgery Length of Surgery: 150 min Special Case Needs: No H&P: Yes, with [facility] Phone Interview???:  yes Interpreter: Yes Medical Clearance:  No Special Scheduling Instructions: No Any known health/anesthesia issues, diabetes, sleep apnea, latex allergy, defibrillator/pacemaker?: No Acuity: P3   (P1 highest, P2 delay may cause harm, P3 low, elective gyn, P4 lowest)

## 2020-08-03 NOTE — Telephone Encounter (Signed)
PRO[facility] CASE  Patient called to schedule robot assist total lap. Hysterectomy w bilateral salpingectomy w [facility]  DOS 08/31/20  H&P 08/15/20 @ 4:10 with [facility]   Covid testing  N/a - pt tested positive 12/22, last day with fever 12/24. PAT has been notified.  Pre-admit phone call appointment to be requested - date and time will be included on H&P paper work. Also all appointments will be updated on pt MyChart. Explained that this appointment has a call window. Based on the time scheduled will indicate if the call will be received within a 4 hour window before 1:00 or after.  Advised that pt may also receive calls from the hospital pharmacy and pre-service center.  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 No secondary insurance.

## 2020-08-15 ENCOUNTER — Encounter: Payer: Self-pay | Admitting: Obstetrics and Gynecology

## 2020-08-15 ENCOUNTER — Other Ambulatory Visit (HOSPITAL_COMMUNITY)
Admission: RE | Admit: 2020-08-15 | Discharge: 2020-08-15 | Disposition: A | Discharge status: Home / Self Care | Payer: No Typology Code available for payment source | Source: Ambulatory Visit | Attending: Obstetrics and Gynecology | Admitting: Obstetrics and Gynecology

## 2020-08-15 ENCOUNTER — Ambulatory Visit (INDEPENDENT_AMBULATORY_CARE_PROVIDER_SITE_OTHER): Payer: No Typology Code available for payment source | Admitting: Obstetrics and Gynecology

## 2020-08-15 ENCOUNTER — Other Ambulatory Visit: Payer: Self-pay

## 2020-08-15 VITALS — BP 110/70 | Ht 64.0 in | Wt 125.4 lb

## 2020-08-15 DIAGNOSIS — D251 Intramural leiomyoma of uterus: Secondary | ICD-10-CM | POA: Diagnosis not present

## 2020-08-15 DIAGNOSIS — N924 Excessive bleeding in the premenopausal period: Secondary | ICD-10-CM

## 2020-08-15 NOTE — Patient Instructions (Signed)
Hysterectomy Information °A hysterectomy is a surgery in which the uterus is removed. The lowest part of the uterus (cervix), which opens into the vagina, may be removed as well. In some cases, the fallopian tubes, the ovaries,  or both the fallopian tubes and the ovaries may also be removed. °This procedure may be done to treat different medical problems. It may also be done to help transgender men feel more masculine. After the procedure, a woman will no longer have menstrual periods and will not be able to become pregnant (sterile). °What are the reasons for a hysterectomy? °There are many reasons why a person might have this procedure. They include: °Persistent, abnormal vaginal bleeding. °Long-term (chronic) pelvic pain or infection. °Endometriosis. This is when the lining of the uterus (endometrium) starts to grow outside the uterus. °Adenomyosis. This is when the endometrium starts to grow in the muscle of the uterus. °Pelvic organ prolapse. This is a condition in which the uterus falls down into the vagina. °Noncancerous growths in the uterus (uterine fibroids) that cause symptoms. °The presence of precancerous cells. °Cervical or uterine cancer. °Sex change. This helps a transgender man complete his female identity. °What are the different types of hysterectomy? °There are three different types of hysterectomy: °Supracervical hysterectomy. In this type, the top part of the uterus is removed, but not the cervix. °Total hysterectomy. In this type, the uterus and cervix are removed. °Radical hysterectomy. In this type, the uterus, the cervix, and the tissue that holds the uterus in place (parametrium) are removed. °What are the different ways a hysterectomy can be performed? °There are many different ways a hysterectomy can be performed, including: °Abdominal hysterectomy. In this type, an incision is made in the abdomen. The uterus is removed through this incision. °Vaginal hysterectomy. In this type, an  incision is made in the vagina. The uterus is removed through this incision. There are no abdominal incisions. °Conventional laparoscopic hysterectomy. In this type, 3 or 4 small incisions are made in the abdomen. A thin, lighted tube with a camera (laparoscope) is inserted into one of the incisions. Other tools are put through the other incisions. The uterus is cut into small pieces. The small pieces are removed through the incisions or the vagina. °Laparoscopically assisted vaginal hysterectomy (LAVH). In this type, 3 or 4 small incisions are made in the abdomen. Part of the surgery is performed laparoscopically and the other part is done vaginally. The uterus is removed through the vagina. °Robot-assisted laparoscopic hysterectomy. In this type, a laparoscope and other tools are inserted into 3 or 4 small incisions in the abdomen. A computer-controlled device is used to give the surgeon a 3D image and to help control the surgical instruments. This allows for more precise movements of surgical instruments. The uterus is cut into small pieces and removed through the incisions or the vagina. °Discuss the options with your health care provider to determine which type is the right one for you. °What are the risks of this surgery? °Generally, this is a safe procedure. However, problems may occur, including: °Bleeding and risk of blood transfusion. Tell your health care provider if you do not want to receive any blood products. °Blood clots in the legs or lung. °Infection. °Damage to nearby structures or organs. °Allergic reactions to medicines. °Having to change to an abdominal hysterectomy after starting a less invasive technique. °What to expect after a hysterectomy °You will be given pain medicine. °You may need to stay in the hospital for 1-2 days   to recover, depending on the type of hysterectomy you had. °You will need to have someone with you for the first 3-5 days after you go home. °You will need to follow up  with your surgeon in 2-4 weeks after surgery to evaluate your progress. °If the ovaries are removed, you will have early menopause symptoms such as hot flashes, night sweats, and insomnia. °If you had a hysterectomy for a problem that was not cancer or a condition that could not lead to cancer, then you no longer need Pap tests. However, even if you no longer need a Pap test, get regular pelvic exams to make sure no other problems are developing. °Questions to ask your health care provider °Is a hysterectomy medically necessary? Do I have other treatment options for my condition? °What are my options for hysterectomy procedure? °What organs and tissues need to be removed? °What are the risks? °What are the benefits? °How long will I need to stay in the hospital after the procedure? °How long will I need to recover at home? °What symptoms can I expect after the procedure? °Summary °A hysterectomy is a surgery in which the uterus is removed. The fallopian tubes, the ovaries, or both may be removed as well. °This procedure may be done to treat different medical problems. It may also be done to complete your female identity during a sex change. °After the procedure, a woman will no longer have a menstrual period and will not be able to become pregnant. °There are three types of hysterectomy. Discuss with your health care provider which options are right for you. °This is a safe procedure, though there are some risks. Risks include infection, bleeding, blood clots, or damage to nearby organs. °This information is not intended to replace advice given to you by your health care provider. Make sure you discuss any questions you have with your health care provider. °Document Revised: 05/21/2019 Document Reviewed: 05/21/2019 °Elsevier Patient Education © 2021 [employer] ° °

## 2020-08-15 NOTE — H&P (View-Only) (Signed)
Patient ID: Leah Moses, female   DOB: 06-25-1976, 45 y.o.   MRN: [id number]  Reason for Consult: Gynecologic Exam   Referred by No ref. provider found  Subjective:     HPI:  Leah Moses is a 45 y.o. female. She is presenting today for a preoperative visit regarding hysterectomy. She has been having significant menorrhagia and dysmenorrhea. She has a history of endometriosis which caused infertility and a fibroid uterus. She has received detailed counseling regarding her options for management. She desires definitve therapy by hysterectomy.   Last pap: 2019 NIL, HPV negative   Past Medical History:  Diagnosis Date  . Dysmenorrhea   . Melanoma ([facility])    Chest, leg, shoulder, back, ankle   History reviewed. No pertinent family history. Past Surgical History:  Procedure Laterality Date  . MELANOMA EXCISION     Multiple sites/dates    Short Social History:  Social History   Tobacco Use  . Smoking status: Never Smoker  . Smokeless tobacco: Never Used  Substance Use Topics  . Alcohol use: Yes    Comment: 1-2/month    No Known Allergies  Current Outpatient Medications  Medication Sig Dispense Refill  . ascorbic acid (VITAMIN C) 1000 MG tablet Take 1,000 mg by mouth daily.    . norethindrone-ethinyl estradiol (JUNEL FE 1/20) 1-20 MG-MCG tablet Take 1 tablet by mouth daily. (Patient not taking: No sig reported) 84 tablet 4   No current facility-administered medications for this visit.    Review of Systems  Constitutional: Negative for chills, fatigue, fever and unexpected weight change.  HENT: Negative for trouble swallowing.  Eyes: Negative for loss of vision.  Respiratory: Negative for cough, shortness of breath and wheezing.  Cardiovascular: Negative for chest pain, leg swelling, palpitations and syncope.  GI: Negative for abdominal pain, blood in stool, diarrhea, nausea and vomiting.  GU: Negative for difficulty urinating, dysuria, frequency and hematuria.   Musculoskeletal: Negative for back pain, leg pain and joint pain.  Skin: Negative for rash.  Neurological: Negative for dizziness, headaches, light-headedness, numbness and seizures.  Psychiatric: Negative for behavioral problem, confusion, depressed mood and sleep disturbance.        Objective:  Objective   Vitals:   08/15/20 1609  BP: 110/70  Weight: 125 lb 6.4 oz (56.9 kg)  Height: 5\' 4"  (1.626 m)   Body mass index is 21.52 kg/m.  Physical Exam Vitals and nursing note reviewed.  Constitutional:      Appearance: She is well-developed and well-nourished.  HENT:     Head: Normocephalic and atraumatic.  Eyes:     Extraocular Movements: EOM normal.     Pupils: Pupils are equal, round, and reactive to light.  Cardiovascular:     Rate and Rhythm: Normal rate and regular rhythm.  Pulmonary:     Effort: Pulmonary effort is normal. No respiratory distress.  Genitourinary:    Comments: External: Normal appearing vulva. No lesions noted.  Speculum examination: Normal appearing cervix. No blood in the vaginal vault. No discharge.   Bimanual examination: Uterus midline, non-tender, enlarged with irregular contour.  No CMT. No adnexal masses. No adnexal tenderness. Pelvis not fixed.  Skin:    General: Skin is warm and dry.  Neurological:     Mental Status: She is alert and oriented to person, place, and time.  Psychiatric:        Mood and Affect: Mood and affect normal.        Behavior: Behavior normal.  Thought Content: Thought content normal.        Judgment: Judgment normal.    Endometrial Biopsy After discussion with the patient regarding her abnormal uterine bleeding I recommended that she proceed with an endometrial biopsy for further diagnosis. The risks, benefits, alternatives, and indications for an endometrial biopsy were discussed with the patient in detail. She understood the risks including infection, bleeding, cervical laceration and uterine perforation.   Verbal consent was obtained.   PROCEDURE NOTE:  Pipelle endometrial biopsy was performed using aseptic technique with iodine preparation.  The uterus was sounded to a length of 7 cm.  Adequate sampling was obtained with minimal blood loss.  The patient tolerated the procedure well.  Disposition will be pending pathology.    Assessment/Plan:     45 yo with menorrhagia, dysmenorrhea, enlarged fibroid uterus and endometriosis  I have had a careful discussion with this patient about all the options available and the risk/benefits of each. I have fully informed this patient that a hysterectomy may subject her to a variety of discomforts and risks: She understands that most patients have surgery with little difficulty, but problems can happen ranging from minor to fatal. These include nausea, vomiting, pain, bleeding, infection, poor healing, hernia, wound separation, vaginal cuff separation or formation of adhesions. Unexpected reactions may occur from any drug or anesthetic given. Unintended injury may occur to other pelvic or abdominal structures such as Fallopian tubes, ovaries, bladder, ureter (tube from kidney to bladder), or bowel. Nerves going from the pelvis to the legs may be injured. Any such injury may require immediate or later additional surgery to correct the problem. Excessive blood loss requiring transfusion is very unlikely but possible. Dangerous blood clots may form in the legs or lungs. Physical and sexual activity will be restricted in varying degrees for an indeterminate period of time but most often 2-4 weeks. She understands that the plan is to do this laparoscopically, however, there is a chance that this will need to be performed via a larger incision. She may be hospitalized overnight. Finally, she understands that it is impossible to list every possible undesirable effect and that the condition for which surgery is done is not always cured or significantly improved, and in rare  cases may be even worse. I have also counseled her extensively about the pros and cons of ovarian conservation versus removal. Ample time was given to answer all questions.  Adrian Prows MD, [facility], [facility] 08/15/2020 5:19 PM

## 2020-08-15 NOTE — Progress Notes (Signed)
  H&P 

## 2020-08-15 NOTE — Progress Notes (Signed)
Patient ID: Leah Moses, female   DOB: 06-25-1976, 45 y.o.   MRN: [id number]  Reason for Consult: Gynecologic Exam   Referred by No ref. provider found  Subjective:     HPI:  Leah Moses is a 45 y.o. female. She is presenting today for a preoperative visit regarding hysterectomy. She has been having significant menorrhagia and dysmenorrhea. She has a history of endometriosis which caused infertility and a fibroid uterus. She has received detailed counseling regarding her options for management. She desires definitve therapy by hysterectomy.   Last pap: 2019 NIL, HPV negative   Past Medical History:  Diagnosis Date  . Dysmenorrhea   . Melanoma ([facility])    Chest, leg, shoulder, back, ankle   History reviewed. No pertinent family history. Past Surgical History:  Procedure Laterality Date  . MELANOMA EXCISION     Multiple sites/dates    Short Social History:  Social History   Tobacco Use  . Smoking status: Never Smoker  . Smokeless tobacco: Never Used  Substance Use Topics  . Alcohol use: Yes    Comment: 1-2/month    No Known Allergies  Current Outpatient Medications  Medication Sig Dispense Refill  . ascorbic acid (VITAMIN C) 1000 MG tablet Take 1,000 mg by mouth daily.    . norethindrone-ethinyl estradiol (JUNEL FE 1/20) 1-20 MG-MCG tablet Take 1 tablet by mouth daily. (Patient not taking: No sig reported) 84 tablet 4   No current facility-administered medications for this visit.    Review of Systems  Constitutional: Negative for chills, fatigue, fever and unexpected weight change.  HENT: Negative for trouble swallowing.  Eyes: Negative for loss of vision.  Respiratory: Negative for cough, shortness of breath and wheezing.  Cardiovascular: Negative for chest pain, leg swelling, palpitations and syncope.  GI: Negative for abdominal pain, blood in stool, diarrhea, nausea and vomiting.  GU: Negative for difficulty urinating, dysuria, frequency and hematuria.   Musculoskeletal: Negative for back pain, leg pain and joint pain.  Skin: Negative for rash.  Neurological: Negative for dizziness, headaches, light-headedness, numbness and seizures.  Psychiatric: Negative for behavioral problem, confusion, depressed mood and sleep disturbance.        Objective:  Objective   Vitals:   08/15/20 1609  BP: 110/70  Weight: 125 lb 6.4 oz (56.9 kg)  Height: 5\' 4"  (1.626 m)   Body mass index is 21.52 kg/m.  Physical Exam Vitals and nursing note reviewed.  Constitutional:      Appearance: She is well-developed and well-nourished.  HENT:     Head: Normocephalic and atraumatic.  Eyes:     Extraocular Movements: EOM normal.     Pupils: Pupils are equal, round, and reactive to light.  Cardiovascular:     Rate and Rhythm: Normal rate and regular rhythm.  Pulmonary:     Effort: Pulmonary effort is normal. No respiratory distress.  Genitourinary:    Comments: External: Normal appearing vulva. No lesions noted.  Speculum examination: Normal appearing cervix. No blood in the vaginal vault. No discharge.   Bimanual examination: Uterus midline, non-tender, enlarged with irregular contour.  No CMT. No adnexal masses. No adnexal tenderness. Pelvis not fixed.  Skin:    General: Skin is warm and dry.  Neurological:     Mental Status: She is alert and oriented to person, place, and time.  Psychiatric:        Mood and Affect: Mood and affect normal.        Behavior: Behavior normal.  Thought Content: Thought content normal.        Judgment: Judgment normal.    Endometrial Biopsy After discussion with the patient regarding her abnormal uterine bleeding I recommended that she proceed with an endometrial biopsy for further diagnosis. The risks, benefits, alternatives, and indications for an endometrial biopsy were discussed with the patient in detail. She understood the risks including infection, bleeding, cervical laceration and uterine perforation.   Verbal consent was obtained.   PROCEDURE NOTE:  Pipelle endometrial biopsy was performed using aseptic technique with iodine preparation.  The uterus was sounded to a length of 7 cm.  Adequate sampling was obtained with minimal blood loss.  The patient tolerated the procedure well.  Disposition will be pending pathology.    Assessment/Plan:     44 yo with menorrhagia, dysmenorrhea, enlarged fibroid uterus and endometriosis  I have had a careful discussion with this patient about all the options available and the risk/benefits of each. I have fully informed this patient that a hysterectomy may subject her to a variety of discomforts and risks: She understands that most patients have surgery with little difficulty, but problems can happen ranging from minor to fatal. These include nausea, vomiting, pain, bleeding, infection, poor healing, hernia, wound separation, vaginal cuff separation or formation of adhesions. Unexpected reactions may occur from any drug or anesthetic given. Unintended injury may occur to other pelvic or abdominal structures such as Fallopian tubes, ovaries, bladder, ureter (tube from kidney to bladder), or bowel. Nerves going from the pelvis to the legs may be injured. Any such injury may require immediate or later additional surgery to correct the problem. Excessive blood loss requiring transfusion is very unlikely but possible. Dangerous blood clots may form in the legs or lungs. Physical and sexual activity will be restricted in varying degrees for an indeterminate period of time but most often 2-4 weeks. She understands that the plan is to do this laparoscopically, however, there is a chance that this will need to be performed via a larger incision. She may be hospitalized overnight. Finally, she understands that it is impossible to list every possible undesirable effect and that the condition for which surgery is done is not always cured or significantly improved, and in rare  cases may be even worse. I have also counseled her extensively about the pros and cons of ovarian conservation versus removal. Ample time was given to answer all questions.  Griffyn Kucinski MD, FACOG [facility] 08/15/2020 5:19 PM   

## 2020-08-16 ENCOUNTER — Encounter
Admission: RE | Admit: 2020-08-16 | Discharge: 2020-08-16 | Disposition: A | Discharge status: Home / Self Care | Payer: No Typology Code available for payment source | Source: Ambulatory Visit | Attending: Obstetrics and Gynecology | Admitting: Obstetrics and Gynecology

## 2020-08-16 HISTORY — DX: Other specified postprocedural states: Z98.890

## 2020-08-16 HISTORY — DX: Family history of other specified conditions: Z84.89

## 2020-08-16 HISTORY — DX: Nausea with vomiting, unspecified: R11.2

## 2020-08-16 NOTE — Patient Instructions (Addendum)
Your procedure is scheduled on: Thursday, January 27 Report to the Registration Desk on the 1st floor of the [facility]. To find out your arrival time, please call [phone] between 1PM - 3PM on: Wednesday, January 26  REMEMBER: Instructions that are not followed completely may result in serious medical risk, up to and including death; or upon the discretion of your surgeon and anesthesiologist your surgery may need to be rescheduled.  Do not eat food after midnight the night before surgery.  No gum chewing, lozengers or hard candies.  You may however, drink CLEAR liquids up to 2 hours before you are scheduled to arrive for your surgery. Do not drink anything within 2 hours of your scheduled arrival time.  Clear liquids include: - water  - apple juice without pulp - gatorade (not RED, PURPLE, OR BLUE) - black coffee or tea (Do NOT add milk or creamers to the coffee or tea) Do NOT drink anything that is not on this list.  In addition, your doctor has ordered for you to drink the provided  Ensure Pre-Surgery Clear Carbohydrate Drink  Drinking this carbohydrate drink up to two hours before surgery helps to reduce insulin resistance and improve patient outcomes. Please complete drinking 2 hours prior to scheduled arrival time.  DO NOT TAKE ANY MEDICATIONS THE MORNING OF SURGERY  One week prior to surgery: starting January 20 Stop Anti-inflammatories (NSAIDS) such as Advil, Aleve, Ibuprofen, Motrin, Naproxen, Naprosyn and Aspirin based products such as Excedrin, Goodys Powder, BC Powder. Stop ANY OVER THE COUNTER supplements until after surgery.  No Alcohol for 24 hours before or after surgery.  No Smoking including e-cigarettes for 24 hours prior to surgery.  No chewable tobacco products for at least 6 hours prior to surgery.  No nicotine patches on the day of surgery.  Do not use any "recreational" drugs for at least a week prior to your surgery.  Please be advised that the  combination of cocaine and anesthesia may have negative outcomes, up to and including death. If you test positive for cocaine, your surgery will be cancelled.  On the morning of surgery brush your teeth with toothpaste and water, you may rinse your mouth with mouthwash if you wish. Do not swallow any toothpaste or mouthwash.  Do not wear jewelry, make-up, hairpins, clips or nail polish.  Do not wear lotions, powders, or perfumes.   Do not shave body from the neck down 48 hours prior to surgery just in case you cut yourself which could leave a site for infection.  Also, freshly shaved skin may become irritated if using the CHG soap.  Contact lenses, hearing aids and dentures may not be worn into surgery.  Do not bring valuables to the hospital. [facility] is not responsible for any missing/lost belongings or valuables.   Use CHG Soap as directed on instruction sheet.  Notify your doctor if there is any change in your medical condition (cold, fever, infection).  Wear comfortable clothing (specific to your surgery type) to the hospital.  Plan for stool softeners for home use; pain medications have a tendency to cause constipation. You can also help prevent constipation by eating foods high in fiber such as fruits and vegetables and drinking plenty of fluids as your diet allows.  After surgery, you can help prevent lung complications by doing breathing exercises.  Take deep breaths and cough every 1-2 hours. Your doctor may order a device called an Incentive Spirometer to help you take deep breaths.  When coughing or sneezing, hold a pillow firmly against your incision with both hands. This is called "splinting." Doing this helps protect your incision. It also decreases belly discomfort.  If you are being admitted to the hospital overnight, leave your suitcase in the car. After surgery it may be brought to your room.  If you are being discharged the day of surgery, you will not be  allowed to drive home. You will need a responsible adult (18 years or older) to drive you home and stay with you that night.   If you are taking public transportation, you will need to have a responsible adult (18 years or older) with you. Please confirm with your physician that it is acceptable to use public transportation.   Please call the [place] at [phone] if you have any questions about these instructions.  Visitation Policy:  Patients undergoing a surgery or procedure may have one family member or support person with them as long as that person is not COVID-19 positive or experiencing its symptoms.  That person may remain in the waiting area during the procedure.  Inpatient Visitation:    Visiting hours are 7 a.m. to 8 p.m. Patients will be allowed one visitor. The visitor may change daily. The visitor must pass COVID-19 screenings, use hand sanitizer when entering and exiting the patient's room and wear a mask at all times, including in the patient's room. Patients must also wear a mask when staff or their visitor are in the room. Masking is required regardless of vaccination status. Systemwide, no visitors 17 or younger.

## 2020-08-17 LAB — SURGICAL PATHOLOGY

## 2020-08-21 ENCOUNTER — Other Ambulatory Visit: Admission: RE | Admit: 2020-08-21 | Payer: No Typology Code available for payment source | Source: Ambulatory Visit

## 2020-08-24 ENCOUNTER — Other Ambulatory Visit: Payer: No Typology Code available for payment source

## 2020-08-25 ENCOUNTER — Other Ambulatory Visit: Payer: Self-pay

## 2020-08-25 ENCOUNTER — Encounter
Admission: RE | Admit: 2020-08-25 | Discharge: 2020-08-25 | Disposition: A | Discharge status: Home / Self Care | Payer: No Typology Code available for payment source | Source: Ambulatory Visit | Attending: Obstetrics and Gynecology | Admitting: Obstetrics and Gynecology

## 2020-08-25 ENCOUNTER — Telehealth: Payer: Self-pay

## 2020-08-25 DIAGNOSIS — Z01812 Encounter for preprocedural laboratory examination: Secondary | ICD-10-CM | POA: Diagnosis present

## 2020-08-25 LAB — TYPE AND SCREEN
ABO/RH(D): A POS
Antibody Screen: NEGATIVE

## 2020-08-25 LAB — CBC
HCT: 34 % — ABNORMAL LOW (ref 36.0–46.0)
Hemoglobin: 10.9 g/dL — ABNORMAL LOW (ref 12.0–15.0)
MCH: 29.6 pg (ref 26.0–34.0)
MCHC: 32.1 g/dL (ref 30.0–36.0)
MCV: 92.4 fL (ref 80.0–100.0)
Platelets: 328 10*3/uL (ref 150–400)
RBC: 3.68 MIL/uL — ABNORMAL LOW (ref 3.87–5.11)
RDW: 12.8 % (ref 11.5–15.5)
WBC: 8 10*3/uL (ref 4.0–10.5)
nRBC: 0 % (ref 0.0–0.2)

## 2020-08-25 NOTE — Telephone Encounter (Signed)
Patient would like to discuss not removing cervix during surgery. [phone]

## 2020-08-25 NOTE — Telephone Encounter (Signed)
Called and discussed with patient

## 2020-08-29 ENCOUNTER — Other Ambulatory Visit
Admission: RE | Admit: 2020-08-29 | Discharge: 2020-08-29 | Disposition: A | Discharge status: Home / Self Care | Payer: No Typology Code available for payment source | Source: Ambulatory Visit | Attending: Obstetrics and Gynecology | Admitting: Obstetrics and Gynecology

## 2020-08-29 NOTE — Pre-Procedure Instructions (Signed)
Patient was positive for covid on 07/26/20 and therefore does not need to be covid tested prior to her surgery. (less than 90 days)

## 2020-08-31 ENCOUNTER — Encounter: Payer: Self-pay | Admitting: Obstetrics and Gynecology

## 2020-08-31 ENCOUNTER — Other Ambulatory Visit: Payer: Self-pay

## 2020-08-31 ENCOUNTER — Ambulatory Visit
Admission: RE | Admit: 2020-08-31 | Discharge: 2020-08-31 | Disposition: A | Discharge status: Home / Self Care | Payer: No Typology Code available for payment source | Attending: Obstetrics and Gynecology | Admitting: Obstetrics and Gynecology

## 2020-08-31 ENCOUNTER — Other Ambulatory Visit: Payer: Self-pay | Admitting: Obstetrics and Gynecology

## 2020-08-31 ENCOUNTER — Encounter
Admission: RE | Disposition: A | Discharge status: Home / Self Care | Payer: Self-pay | Source: Home / Self Care | Attending: Obstetrics and Gynecology

## 2020-08-31 ENCOUNTER — Ambulatory Visit: Payer: No Typology Code available for payment source | Admitting: Urgent Care

## 2020-08-31 ENCOUNTER — Ambulatory Visit: Payer: No Typology Code available for payment source | Admitting: Anesthesiology

## 2020-08-31 DIAGNOSIS — N946 Dysmenorrhea, unspecified: Secondary | ICD-10-CM | POA: Diagnosis not present

## 2020-08-31 DIAGNOSIS — D250 Submucous leiomyoma of uterus: Secondary | ICD-10-CM | POA: Diagnosis present

## 2020-08-31 DIAGNOSIS — Z79899 Other long term (current) drug therapy: Secondary | ICD-10-CM | POA: Insufficient documentation

## 2020-08-31 DIAGNOSIS — N809 Endometriosis, unspecified: Secondary | ICD-10-CM | POA: Diagnosis not present

## 2020-08-31 DIAGNOSIS — Z85820 Personal history of malignant melanoma of skin: Secondary | ICD-10-CM | POA: Insufficient documentation

## 2020-08-31 DIAGNOSIS — D251 Intramural leiomyoma of uterus: Secondary | ICD-10-CM | POA: Diagnosis not present

## 2020-08-31 DIAGNOSIS — N924 Excessive bleeding in the premenopausal period: Secondary | ICD-10-CM

## 2020-08-31 DIAGNOSIS — N920 Excessive and frequent menstruation with regular cycle: Secondary | ICD-10-CM | POA: Insufficient documentation

## 2020-08-31 DIAGNOSIS — Z90710 Acquired absence of both cervix and uterus: Secondary | ICD-10-CM

## 2020-08-31 HISTORY — PX: ROBOTIC ASSISTED LAPAROSCOPIC HYSTERECTOMY AND SALPINGECTOMY: SHX6379

## 2020-08-31 LAB — ABO/RH: ABO/RH(D): A POS

## 2020-08-31 LAB — POCT PREGNANCY, URINE: Preg Test, Ur: NEGATIVE

## 2020-08-31 SURGERY — XI ROBOTIC ASSISTED LAPAROSCOPIC HYSTERECTOMY AND SALPINGECTOMY
Anesthesia: General | Laterality: Bilateral

## 2020-08-31 MED ORDER — SCOPOLAMINE 1 MG/3DAYS TD PT72: 1.0000 | MEDICATED_PATCH | Freq: Once | TRANSDERMAL | Status: DC

## 2020-08-31 MED ORDER — KETAMINE HCL 10 MG/ML IJ SOLN
INTRAMUSCULAR | Status: DC | PRN
  Administered 2020-08-31 (×2): 10 mg via INTRAVENOUS
  Administered 2020-08-31: 25 mg via INTRAVENOUS
  Administered 2020-08-31: 5 mg via INTRAVENOUS

## 2020-08-31 MED ORDER — OXYCODONE HCL 5 MG PO TABS: 5.0000 mg | ORAL_TABLET | Freq: Four times a day (QID) | ORAL | 0 refills | Status: AC | PRN

## 2020-08-31 MED ORDER — BUPIVACAINE LIPOSOME 1.3 % IJ SUSP
INTRAMUSCULAR | Status: DC | PRN
  Administered 2020-08-31: 20 mL

## 2020-08-31 MED ORDER — ACETAMINOPHEN 10 MG/ML IV SOLN
INTRAVENOUS | Status: AC
  Filled 2020-08-31: qty 100

## 2020-08-31 MED ORDER — DEXMEDETOMIDINE (PRECEDEX) IN NS 20 MCG/5ML (4 MCG/ML) IV SYRINGE
PREFILLED_SYRINGE | INTRAVENOUS | Status: AC
  Filled 2020-08-31: qty 5

## 2020-08-31 MED ORDER — CEFAZOLIN SODIUM-DEXTROSE 2-4 GM/100ML-% IV SOLN
INTRAVENOUS | Status: AC
  Filled 2020-08-31: qty 100

## 2020-08-31 MED ORDER — FENTANYL CITRATE (PF) 100 MCG/2ML IJ SOLN
INTRAMUSCULAR | Status: AC
  Administered 2020-08-31: 25 ug via INTRAVENOUS
  Filled 2020-08-31: qty 2

## 2020-08-31 MED ORDER — FENTANYL CITRATE (PF) 100 MCG/2ML IJ SOLN
INTRAMUSCULAR | Status: AC
  Filled 2020-08-31: qty 2

## 2020-08-31 MED ORDER — MIDAZOLAM HCL 2 MG/2ML IJ SOLN
INTRAMUSCULAR | Status: DC | PRN
  Administered 2020-08-31: 2 mg via INTRAVENOUS

## 2020-08-31 MED ORDER — ORAL CARE MOUTH RINSE: 15.0000 mL | Freq: Once | OROMUCOSAL | Status: AC

## 2020-08-31 MED ORDER — POVIDONE-IODINE 10 % EX SWAB
2.0000 "application " | Freq: Once | CUTANEOUS | Status: AC
  Administered 2020-08-31: 2 via TOPICAL

## 2020-08-31 MED ORDER — EPHEDRINE SULFATE 50 MG/ML IJ SOLN
INTRAMUSCULAR | Status: DC | PRN
  Administered 2020-08-31: 10 mg via INTRAVENOUS

## 2020-08-31 MED ORDER — ONDANSETRON HCL 4 MG/2ML IJ SOLN
INTRAMUSCULAR | Status: DC | PRN
  Administered 2020-08-31: 4 mg via INTRAVENOUS

## 2020-08-31 MED ORDER — PROPOFOL 500 MG/50ML IV EMUL
INTRAVENOUS | Status: AC
  Filled 2020-08-31: qty 50

## 2020-08-31 MED ORDER — KETOROLAC TROMETHAMINE 30 MG/ML IJ SOLN
INTRAMUSCULAR | Status: AC
  Filled 2020-08-31: qty 1

## 2020-08-31 MED ORDER — FAMOTIDINE 20 MG PO TABS: 20.0000 mg | ORAL_TABLET | Freq: Once | ORAL | Status: AC

## 2020-08-31 MED ORDER — IBUPROFEN 600 MG PO TABS: 600.0000 mg | ORAL_TABLET | Freq: Four times a day (QID) | ORAL | 0 refills | Status: AC | PRN

## 2020-08-31 MED ORDER — EPHEDRINE 5 MG/ML INJ
INTRAVENOUS | Status: AC
  Filled 2020-08-31: qty 10

## 2020-08-31 MED ORDER — CHLORHEXIDINE GLUCONATE 0.12 % MT SOLN
OROMUCOSAL | Status: AC
  Administered 2020-08-31: 15 mL via OROMUCOSAL
  Filled 2020-08-31: qty 15

## 2020-08-31 MED ORDER — KETAMINE HCL 50 MG/5ML IJ SOSY
PREFILLED_SYRINGE | INTRAMUSCULAR | Status: AC
  Filled 2020-08-31: qty 5

## 2020-08-31 MED ORDER — FAMOTIDINE 20 MG PO TABS
ORAL_TABLET | ORAL | Status: AC
  Administered 2020-08-31: 20 mg via ORAL
  Filled 2020-08-31: qty 1

## 2020-08-31 MED ORDER — GLYCOPYRROLATE 0.2 MG/ML IJ SOLN
INTRAMUSCULAR | Status: DC | PRN
  Administered 2020-08-31: .2 mg via INTRAVENOUS

## 2020-08-31 MED ORDER — CHLORHEXIDINE GLUCONATE 0.12 % MT SOLN: 15.0000 mL | Freq: Once | OROMUCOSAL | Status: AC

## 2020-08-31 MED ORDER — ACETAMINOPHEN 10 MG/ML IV SOLN
INTRAVENOUS | Status: DC | PRN
  Administered 2020-08-31: 1000 mg via INTRAVENOUS

## 2020-08-31 MED ORDER — SCOPOLAMINE 1 MG/3DAYS TD PT72
MEDICATED_PATCH | TRANSDERMAL | Status: AC
  Administered 2020-08-31: 1.5 mg via TRANSDERMAL
  Filled 2020-08-31: qty 1

## 2020-08-31 MED ORDER — ROCURONIUM BROMIDE 100 MG/10ML IV SOLN
INTRAVENOUS | Status: DC | PRN
  Administered 2020-08-31: 50 mg via INTRAVENOUS
  Administered 2020-08-31: 20 mg via INTRAVENOUS
  Administered 2020-08-31: 10 mg via INTRAVENOUS

## 2020-08-31 MED ORDER — DEXAMETHASONE SODIUM PHOSPHATE 10 MG/ML IJ SOLN
INTRAMUSCULAR | Status: DC | PRN
  Administered 2020-08-31: 10 mg via INTRAVENOUS

## 2020-08-31 MED ORDER — PROPOFOL 500 MG/50ML IV EMUL
INTRAVENOUS | Status: DC | PRN
  Administered 2020-08-31: 125 ug/kg/min via INTRAVENOUS

## 2020-08-31 MED ORDER — BUPIVACAINE LIPOSOME 1.3 % IJ SUSP
INTRAMUSCULAR | Status: AC
  Filled 2020-08-31: qty 20

## 2020-08-31 MED ORDER — KETOROLAC TROMETHAMINE 30 MG/ML IJ SOLN
INTRAMUSCULAR | Status: DC | PRN
  Administered 2020-08-31: 30 mg via INTRAVENOUS

## 2020-08-31 MED ORDER — PROMETHAZINE HCL 25 MG/ML IJ SOLN: 6.2500 mg | INTRAMUSCULAR | Status: DC | PRN

## 2020-08-31 MED ORDER — CEFAZOLIN SODIUM-DEXTROSE 2-4 GM/100ML-% IV SOLN
2.0000 g | INTRAVENOUS | Status: AC
  Administered 2020-08-31: 2 g via INTRAVENOUS

## 2020-08-31 MED ORDER — SUGAMMADEX SODIUM 200 MG/2ML IV SOLN
INTRAVENOUS | Status: DC | PRN
  Administered 2020-08-31: 200 mg via INTRAVENOUS

## 2020-08-31 MED ORDER — DEXMEDETOMIDINE (PRECEDEX) IN NS 20 MCG/5ML (4 MCG/ML) IV SYRINGE
PREFILLED_SYRINGE | INTRAVENOUS | Status: DC | PRN
  Administered 2020-08-31: 4 ug via INTRAVENOUS
  Administered 2020-08-31: 8 ug via INTRAVENOUS
  Administered 2020-08-31: 4 ug via INTRAVENOUS

## 2020-08-31 MED ORDER — PROPOFOL 10 MG/ML IV BOLUS
INTRAVENOUS | Status: DC | PRN
  Administered 2020-08-31: 50 mg via INTRAVENOUS
  Administered 2020-08-31: 150 mg via INTRAVENOUS

## 2020-08-31 MED ORDER — FENTANYL CITRATE (PF) 100 MCG/2ML IJ SOLN
25.0000 ug | INTRAMUSCULAR | Status: DC | PRN
  Administered 2020-08-31 (×3): 25 ug via INTRAVENOUS

## 2020-08-31 MED ORDER — LACTATED RINGERS IV SOLN: INTRAVENOUS | Status: DC

## 2020-08-31 MED ORDER — SCOPOLAMINE 1 MG/3DAYS TD PT72
MEDICATED_PATCH | TRANSDERMAL | Status: AC
  Filled 2020-08-31: qty 1

## 2020-08-31 MED ORDER — LIDOCAINE HCL (CARDIAC) PF 100 MG/5ML IV SOSY
PREFILLED_SYRINGE | INTRAVENOUS | Status: DC | PRN
  Administered 2020-08-31 (×2): 50 mg via INTRAVENOUS

## 2020-08-31 MED ORDER — FENTANYL CITRATE (PF) 100 MCG/2ML IJ SOLN
INTRAMUSCULAR | Status: DC | PRN
  Administered 2020-08-31 (×4): 50 ug via INTRAVENOUS

## 2020-08-31 MED ORDER — ACETAMINOPHEN 500 MG PO TABS
500.0000 mg | ORAL_TABLET | Freq: Four times a day (QID) | ORAL | 0 refills | Status: AC | PRN
Start: 2020-08-31 — End: ?

## 2020-08-31 MED ORDER — GLYCOPYRROLATE 0.2 MG/ML IJ SOLN
INTRAMUSCULAR | Status: AC
  Filled 2020-08-31: qty 1

## 2020-08-31 SURGICAL SUPPLY — 71 items
BAG URINE DRAIN 2000ML AR STRL (UROLOGICAL SUPPLIES) ×2 IMPLANT
BLADE SURG SZ10 CARB STEEL (BLADE) ×2 IMPLANT
BLADE SURG SZ11 CARB STEEL (BLADE) ×2 IMPLANT
CATH FOLEY 2WAY  5CC 16FR (CATHETERS) ×1
CATH URTH 16FR FL 2W BLN LF (CATHETERS) ×1 IMPLANT
CHLORAPREP W/TINT 26 (MISCELLANEOUS) ×2 IMPLANT
CORD BIP STRL DISP 12FT (MISCELLANEOUS) ×2 IMPLANT
COVER BACK TABLE REUSABLE LG (DRAPES) ×2 IMPLANT
COVER MAYO STAND REUSABLE (DRAPES) ×2 IMPLANT
COVER TIP SHEARS 8 DVNC (MISCELLANEOUS) ×1 IMPLANT
COVER TIP SHEARS 8MM DA VINCI (MISCELLANEOUS) ×1
COVER WAND RF STERILE (DRAPES) ×2 IMPLANT
DEFOGGER SCOPE WARMER CLEARIFY (MISCELLANEOUS) ×2 IMPLANT
DERMABOND ADVANCED (GAUZE/BANDAGES/DRESSINGS) ×1
DERMABOND ADVANCED .7 DNX12 (GAUZE/BANDAGES/DRESSINGS) ×1 IMPLANT
DRAPE 3/4 80X56 (DRAPES) ×4 IMPLANT
DRAPE ARM DVNC X/XI (DISPOSABLE) ×1 IMPLANT
DRAPE DA VINCI XI ARM (DISPOSABLE) ×1
DRAPE LEGGINS SURG 28X43 STRL (DRAPES) ×2 IMPLANT
DRAPE UNDER BUTTOCK W/FLU (DRAPES) ×2 IMPLANT
ELECT REM PT RETURN 9FT ADLT (ELECTROSURGICAL) ×2
ELECTRODE REM PT RTRN 9FT ADLT (ELECTROSURGICAL) ×1 IMPLANT
FILTER LAP SMOKE EVAC STRL (MISCELLANEOUS) ×2 IMPLANT
GLOVE BIOGEL PI IND STRL 7.5 (GLOVE) ×2 IMPLANT
GLOVE BIOGEL PI INDICATOR 7.5 (GLOVE) ×2
GLOVE SURG ENC MOIS LTX SZ7 (GLOVE) ×4 IMPLANT
GOWN STRL REUS W/ TWL LRG LVL3 (GOWN DISPOSABLE) ×6 IMPLANT
GOWN STRL REUS W/TWL LRG LVL3 (GOWN DISPOSABLE) ×6
GRASPER SUT TROCAR 14GX15 (MISCELLANEOUS) ×2 IMPLANT
IRRIGATION STRYKERFLOW (MISCELLANEOUS) ×1 IMPLANT
IRRIGATOR STRYKERFLOW (MISCELLANEOUS) ×2
IV NS 1000ML (IV SOLUTION) ×2
IV NS 1000ML BAXH (IV SOLUTION) ×2 IMPLANT
KIT IMAGING PINPOINTPAQ (MISCELLANEOUS) IMPLANT
KIT PINK PAD W/HEAD ARE REST (MISCELLANEOUS) ×2
KIT PINK PAD W/HEAD ARM REST (MISCELLANEOUS) ×1 IMPLANT
KIT TURNOVER CYSTO (KITS) ×2 IMPLANT
LABEL OR SOLS (LABEL) ×2 IMPLANT
MANIFOLD NEPTUNE II (INSTRUMENTS) ×2 IMPLANT
MANIPULATOR VCARE LG CRV RETR (MISCELLANEOUS) ×2 IMPLANT
NEEDLE HYPO 22GX1.5 SAFETY (NEEDLE) ×2 IMPLANT
NS IRRIG 1000ML POUR BTL (IV SOLUTION) ×2 IMPLANT
OBTURATOR OPTICAL STANDARD 8MM (TROCAR) ×1
OBTURATOR OPTICAL STND 8 DVNC (TROCAR) ×1
OBTURATOR OPTICALSTD 8 DVNC (TROCAR) ×1 IMPLANT
OCCLUDER COLPOPNEUMO (BALLOONS) ×2 IMPLANT
PACK LAP CHOLECYSTECTOMY (MISCELLANEOUS) ×2 IMPLANT
PAD ARMBOARD 7.5X6 YLW CONV (MISCELLANEOUS) ×2 IMPLANT
PAD OB MATERNITY 4.3X12.25 (PERSONAL CARE ITEMS) ×2 IMPLANT
PAD PREP 24X41 OB/GYN DISP (PERSONAL CARE ITEMS) ×2 IMPLANT
SCISSORS METZENBAUM CVD 33 (INSTRUMENTS) ×2 IMPLANT
SOLUTION ELECTROLUBE (MISCELLANEOUS) ×2 IMPLANT
SPONGE LAP 18X18 RF (DISPOSABLE) ×2 IMPLANT
SURGILUBE 2OZ TUBE FLIPTOP (MISCELLANEOUS) ×2 IMPLANT
SUT DVC VLOC 180 0 12IN GS21 (SUTURE) ×4
SUT VIC AB 0 CT2 27 (SUTURE) ×2 IMPLANT
SUT VIC AB 1 CT1 36 (SUTURE) ×4 IMPLANT
SUT VIC AB 2-0 CT1 27 (SUTURE) ×1
SUT VIC AB 2-0 CT1 TAPERPNT 27 (SUTURE) ×1 IMPLANT
SUT VIC AB 4-0 SH 27 (SUTURE) ×2
SUT VIC AB 4-0 SH 27XANBCTRL (SUTURE) ×2 IMPLANT
SUT VICRYL 0 AB UR-6 (SUTURE) ×2 IMPLANT
SUT VLOC 180 0 6IN GS21 (SUTURE) ×2 IMPLANT
SUTURE DVC VLC 180 0 12IN GS21 (SUTURE) ×2 IMPLANT
SYR 10ML LL (SYRINGE) ×2 IMPLANT
SYR 50ML LL SCALE MARK (SYRINGE) ×2 IMPLANT
TROCAR 12M 150ML BLUNT (TROCAR) ×2 IMPLANT
TROCAR ENDO BLADELESS 11MM (ENDOMECHANICALS) ×2 IMPLANT
TROCAR XCEL 12X100 BLDLESS (ENDOMECHANICALS) ×4 IMPLANT
TROCAR XCEL NON-BLD 5MMX100MML (ENDOMECHANICALS) ×2 IMPLANT
TUBING EVAC SMOKE HEATED PNEUM (TUBING) ×2 IMPLANT

## 2020-08-31 NOTE — Interval H&P Note (Signed)
History and Physical Interval Note:  08/31/2020 4:31 PM  Leah Moses  has presented today for surgery, with the diagnosis of ENLARGED FIBROID UTERUS.  The various methods of treatment have been discussed with the patient and family. After consideration of risks, benefits and other options for treatment, the patient has consented to  Procedure(s): XI ROBOTIC ASSISTED LAPAROSCOPIC HYSTERECTOMY AND SALPINGECTOMY (Bilateral) as a surgical intervention.  The patient's history has been reviewed, patient examined, no change in status, stable for surgery.  I have reviewed the patient's chart and labs.  Questions were answered to the patient's satisfaction.     [place]

## 2020-08-31 NOTE — Discharge Instructions (Signed)

## 2020-08-31 NOTE — Transfer of Care (Signed)
Immediate Anesthesia Transfer of Care Note  Patient: Leah Moses  Procedure(s) Performed: XI ROBOTIC ASSISTED LAPAROSCOPIC HYSTERECTOMY AND SALPINGECTOMY (Bilateral )  Patient Location: PACU  Anesthesia Type:General  Level of Consciousness: awake  Airway & Oxygen Therapy: Patient Spontanous Breathing and Patient connected to face mask oxygen  Post-op Assessment: Report given to RN and Post -op Vital signs reviewed and stable  Post vital signs: Reviewed  Last Vitals:  Vitals Value Taken Time  BP    Temp    Pulse    Resp    SpO2      Last Pain:  Vitals:   08/31/20 1430  TempSrc: Tympanic  PainSc: 0-No pain         Complications: No complications documented.

## 2020-08-31 NOTE — Anesthesia Preprocedure Evaluation (Signed)
Anesthesia Evaluation  Patient identified by MRN, date of birth, ID band Patient awake    Reviewed: Allergy & Precautions, H&P , NPO status , Patient's Chart, lab work & pertinent test results, reviewed documented beta blocker date and time   History of Anesthesia Complications (+) PONV, Family history of anesthesia reaction and history of anesthetic complications  Airway Mallampati: II  TM Distance: >3 FB Neck ROM: full    Dental  (+) Dental Advidsory Given, Teeth Intact   Pulmonary neg pulmonary ROS,    Pulmonary exam normal breath sounds clear to auscultation       Cardiovascular Exercise Tolerance: Good negative cardio ROS Normal cardiovascular exam Rhythm:regular Rate:Normal     Neuro/Psych negative neurological ROS  negative psych ROS   GI/Hepatic negative GI ROS, Neg liver ROS,   Endo/Other  negative endocrine ROS  Renal/GU negative Renal ROS  negative genitourinary   Musculoskeletal   Abdominal   Peds  Hematology negative hematology ROS (+)   Anesthesia Other Findings Past Medical History: No date: Dysmenorrhea No date: Family history of adverse reaction to anesthesia     Comment:  Sister with N/V No date: Melanoma ([place])     Comment:  Chest, leg, shoulder, back, ankle No date: PONV (postoperative nausea and vomiting)   Reproductive/Obstetrics negative OB ROS                             Anesthesia Physical Anesthesia Plan  ASA: I  Anesthesia Plan: General   Post-op Pain Management:    Induction: Intravenous  PONV Risk Score and Plan: 4 or greater and Ondansetron, Dexamethasone, TIVA, Midazolam, Propofol infusion and Treatment may vary due to age or medical condition  Airway Management Planned: Oral ETT  Additional Equipment:   Intra-op Plan:   Post-operative Plan: Extubation in OR  Informed Consent: I have reviewed the patients History and Physical, chart, labs  and discussed the procedure including the risks, benefits and alternatives for the proposed anesthesia with the patient or authorized representative who has indicated his/her understanding and acceptance.     Dental Advisory Given  Plan Discussed with: Anesthesiologist, CRNA and Surgeon  Anesthesia Plan Comments:         Anesthesia Quick Evaluation

## 2020-08-31 NOTE — Progress Notes (Unsigned)
r 

## 2020-08-31 NOTE — Op Note (Signed)
  Operative Note   PRE-OP DIAGNOSIS: Symptomatic leiomyoma  POST-OP DIAGNOSIS: Symptomatic leiomyoma  SURGEON: Christanna Schuman MD  ANESTHESIA: General  PROCEDURE: Procedure(s):Robotic assisted total hysterectomy and bilateral salpingectomy with contained manual morcellation within an EndoCatch bag  ESTIMATED BLOOD LOSS: 50 cc  DRAINS: Foley  SPECIMENS:  Uterus, and bilateral fallopian tubes  COMPLICATIONS: None  DISPOSITION: PACU  CONDITION: Stable  INDICATIONS: Symptomatic leiomyoma  FINDINGS: Exam under anesthesia revealed an enlarged 10 week mobile globular and irregular uterus. There were no adnexal masses or nodularity. The parametria was smooth. The cervix was negative for gross lesions. Intraoperative findings included: The uterus was enlarged and enlongated to 10 cm with multiple leiomyoma. The adnexa were normal bilaterally. The upper abdomen was normal including omentum, bowel, liver, stomach, and diaphragmatic surfaces. There was no evidence of grossly enlarged pelvic or right para-aortic lymph nodes.   PROCEDURE IN DETAIL: After informed consent was obtained, the patient was taken to the operating room where anesthesia was obtained without difficulty. The patient was positioned in the dorsal lithotomy position in [place] and her arms were carefully tucked at her sides and the usual precautions were taken.  She was prepped and draped in normal sterile fashion.  Time-out was performed. A foley was placed in the bladder. A speculum was placed in the vagina and the cervical os was dilator. The uterus sounded to 8 cm.  A standard VCare uterine manipulator was then placed in the uterus without incident.    Operative entry was obtained via a supraumbilical incision and direct entry. The Optiview port was placed, abdomen insuffulated, and pelvis visualized with noted findings above.  The patient was placed in Trendelenburg and the bowel was displaced up into the upper  abdomen.  The robotic ports and LUQ port placement, and robotic docking was performed. Bilateral salpingectomy was performed. Round ligaments were divided on each side and the retroperitoneal space was opened bilaterally.  With hemostasis secured, the infundibulopelvic ligaments were skeletonized, and the ureters were identified and preserved.  The bilateral utero-ovarian ligaments were sealed and divided.  A bladder flap was created and the bladder was dissected down off the lower uterine segment and cervix. The fibroids uterus was manipulated with the robotic tenaculum and the uterine arteries were skeletonized bilaterally, sealed and divided with the Vesselsealer device or cautery.  A colpotomy was performed circumferentially along the V-Care ring with electrocautery and the cervix was incised from the vagina. The V-care was removed.  The specimen was passed through the vagina.  A pneumo balloon was placed in the vagina and the vaginal cuff was then closed in a running continuous fashion using 0 V-Lock suture with careful attention to include the vaginal cuff angles and the vaginal mucosa within the closure.    The specimen was placed in a bag and brought through the umbilical incision. Contained manual morcellations was performed within the laparoscopic bag and removed completely.   Hemostasis was observed.    The trocars were removed.  The skin incision were closed with subcuticular stitch and Indermil glue.  The patient tolerated the procedure well.  Sponge, lap and needle counts were correct x2.  The patient was taken to recovery room in excellent condition.  Antibiotics: Given 1st or 2nd generation cephalosporin, Antibiotics given within 1 hour of the start of the procedure, Antibiotics ordered to be discontinued within 24 hours post procedure  Adrian Prows MD, [place], [facility] 08/31/2020 7:45 PM

## 2020-08-31 NOTE — Anesthesia Procedure Notes (Signed)
Procedure Name: Intubation Performed by: Joey Hudock, CRNA Pre-anesthesia Checklist: Patient identified, Patient being monitored, Timeout performed, Emergency Drugs available and Suction available Patient Re-evaluated:Patient Re-evaluated prior to induction Oxygen Delivery Method: Circle system utilized Preoxygenation: Pre-oxygenation with 100% oxygen Induction Type: IV induction Ventilation: Mask ventilation without difficulty Laryngoscope Size: 3 and McGraph Grade View: Grade I Tube type: Oral Tube size: 7.0 mm Number of attempts: 1 Airway Equipment and Method: Stylet and Video-laryngoscopy Placement Confirmation: ETT inserted through vocal cords under direct vision,  positive ETCO2 and breath sounds checked- equal and bilateral Secured at: 21 cm Tube secured with: Tape Dental Injury: Teeth and Oropharynx as per pre-operative assessment        

## 2020-09-01 ENCOUNTER — Encounter: Payer: Self-pay | Admitting: Obstetrics and Gynecology

## 2020-09-01 NOTE — Anesthesia Postprocedure Evaluation (Signed)
Anesthesia Post Note  Patient: Leah Moses  Procedure(s) Performed: XI ROBOTIC ASSISTED LAPAROSCOPIC HYSTERECTOMY AND SALPINGECTOMY (Bilateral )  Patient location during evaluation: PACU Anesthesia Type: General Level of consciousness: awake and alert Pain management: pain level controlled Vital Signs Assessment: post-procedure vital signs reviewed and stable Respiratory status: spontaneous breathing, nonlabored ventilation and respiratory function stable Cardiovascular status: blood pressure returned to baseline and stable Postop Assessment: no apparent nausea or vomiting Anesthetic complications: no   No complications documented.   Last Vitals:  Vitals:   08/31/20 2115 08/31/20 2130  BP: 124/70 (!) 121/54  Pulse: 88 93  Resp: 18 20  Temp: (!) 36.1 C   SpO2: 99% 100%    Last Pain:  Vitals:   08/31/20 2130  TempSrc:   PainSc: [place]

## 2020-09-04 ENCOUNTER — Telehealth: Payer: Self-pay

## 2020-09-04 ENCOUNTER — Other Ambulatory Visit: Payer: Self-pay | Admitting: Obstetrics and Gynecology

## 2020-09-04 DIAGNOSIS — Z90710 Acquired absence of both cervix and uterus: Secondary | ICD-10-CM

## 2020-09-04 LAB — SURGICAL PATHOLOGY

## 2020-09-04 MED ORDER — POLYETHYLENE GLYCOL 3350 17 G PO PACK: 17.0000 g | PACK | Freq: Every day | ORAL | 0 refills | Status: AC | PRN

## 2020-09-04 MED ORDER — METOCLOPRAMIDE HCL 10 MG PO TABS
10.0000 mg | ORAL_TABLET | Freq: Three times a day (TID) | ORAL | 0 refills | Status: AC
Start: 2020-09-04 — End: ?

## 2020-09-04 NOTE — Telephone Encounter (Signed)
Patient spoke w/after hours on call service to report fever of 99.7. Had hysterectomy 08/31/20. Patient was advised to discuss w/MD/NP/PA within 24 hours. On call (JEG) was paged. Patient was advised ED eval would be needed.

## 2020-09-04 NOTE — Telephone Encounter (Signed)
LMVM TRC. Inquiring how patient is feeling today?

## 2020-09-07 ENCOUNTER — Other Ambulatory Visit: Payer: Self-pay

## 2020-09-07 ENCOUNTER — Encounter: Payer: Self-pay | Admitting: Obstetrics and Gynecology

## 2020-09-07 ENCOUNTER — Ambulatory Visit (INDEPENDENT_AMBULATORY_CARE_PROVIDER_SITE_OTHER): Payer: No Typology Code available for payment source | Admitting: Obstetrics and Gynecology

## 2020-09-07 VITALS — BP 114/74 | Ht 64.0 in | Wt 123.2 lb

## 2020-09-07 DIAGNOSIS — R300 Dysuria: Secondary | ICD-10-CM

## 2020-09-07 DIAGNOSIS — Z90710 Acquired absence of both cervix and uterus: Secondary | ICD-10-CM

## 2020-09-07 LAB — POCT URINALYSIS DIPSTICK
Bilirubin, UA: NEGATIVE
Blood, UA: NEGATIVE
Glucose, UA: NEGATIVE
Ketones, UA: NEGATIVE
Leukocytes, UA: NEGATIVE
Nitrite, UA: NEGATIVE
Protein, UA: NEGATIVE
Spec Grav, UA: 1.025 (ref 1.010–1.025)
Urobilinogen, UA: 0.2 E.U./dL
pH, UA: 5 (ref 5.0–8.0)

## 2020-09-07 NOTE — Progress Notes (Signed)
Post Op visit

## 2020-09-07 NOTE — Patient Instructions (Signed)
Laparoscopically Assisted Vaginal Hysterectomy, Care After The following information offers guidance on how to care for yourself after your procedure. Your health care provider may also give you more specific instructions. If you have problems or questions, contact your health care provider. What can I expect after the procedure? After the procedure, it is common to have:  Soreness and numbness in your incision areas.  Abdominal pain. You will be given pain medicine to control it.  Vaginal bleeding and discharge. You will need to use a sanitary pad after this procedure.  Tiredness (fatigue).  Poor appetite.  Less interest in sex.  Feelings of sadness or other emotions. If your ovaries were also removed, it is common to have symptoms of menopause, such as hot flashes, night sweats, and lack of sleep (insomnia). Follow these instructions at home: Medicines  Take over-the-counter and prescription medicines only as told by your health care provider.  Do not take aspirin or NSAIDs, such as ibuprofen. These medicines can cause bleeding.  Ask your health care provider if the medicine prescribed to you: ? Requires you to avoid driving or using heavy machinery. ? Can cause constipation. You may need to take these actions to prevent or treat constipation:  Drink enough fluid to keep your urine pale yellow.  Take over-the-counter or prescription medicines.  Eat foods that are high in fiber, such as beans, whole grains, and fresh fruits and vegetables.  Limit foods that are high in fat and processed sugars, such as fried or sweet foods. Incision care  Follow instructions from your health care provider about how to take care of your incisions. Make sure you: ? Wash your hands with soap and water for at least 20 seconds before and after you change your bandage (dressing). If soap and water are not available, use hand sanitizer. ? Change your dressing as told by your health care  provider. ? Leave stitches (sutures), skin glue, or adhesive strips in place. These skin closures may need to stay in place for 2 weeks or longer. If adhesive strip edges start to loosen and curl up, you may trim the loose edges. Do not remove adhesive strips completely unless your health care provider tells you to do that.  Check your incision areas every day for signs of infection. Check for: ? More redness, swelling, or pain. ? Fluid or blood. ? Warmth. ? Pus or a bad smell.   Activity  Rest as told by your healthcare provider.  Return to your normal activities as told by your health care provider. Ask your health care provider what activities are safe for you.  Avoid sitting for a long time without moving. Get up to take short walks every 1-2 hours. This is important to improve blood flow and breathing. Ask for help if you feel weak or unsteady.  Do not lift anything that is heavier than 10 lb (4.5 kg), or the limit that you are told, until your health care provider says that it is safe.  If you were given a sedative during the procedure, it can affect you for several hours. Do not drive or operate machinery until your health care provider says that it is safe.   Lifestyle  Do not use any products that contain nicotine or tobacco. These products include cigarettes, chewing tobacco, and vaping devices, such as e-cigarettes. These can delay healing after surgery. If you need help quitting, ask your health care provider.  Do not drink alcohol until your health care provider approves. General  instructions  Do not douche, use tampons, or have sex for at least 6 weeks, or as told by your health care provider.  If you struggle with physical or emotional changes after your procedure, speak with your health care provider or a therapist.  Do not take baths, swim, or use a hot tub until your health care provider approves. You may only be allowed to take showers for 2-3 weeks.  Keep your  dressing dry until your health care provider says it can be removed.  Try to have someone at home with you for the first 1-2 weeks to help with your daily chores.  Wear compression stockings as told by your health care provider. These stockings help to prevent blood clots and reduce swelling in your legs.  Keep all follow-up visits. This is important.   Contact a health care provider if:  You have any of these signs of infection: ? More redness, swelling, warmth, or pain around an incision. ? Fluid or blood coming from an incision. ? Pus or a bad smell coming from an incision. ? Chills or a fever.  An incision opens.  Your pain medicine is not helping.  You feel dizzy or light-headed.  You have pain or bleeding when you urinate.  You have nausea and vomiting that does not go away.  You have pus, or a bad-smelling discharge coming from your vagina. Get help right away if:  You have a fever and your symptoms suddenly get worse.  You have severe abdominal pain.  You have chest pain.  You have shortness of breath.  You faint.  You have pain, swelling, or redness in your leg.  You have heavy vaginal bleeding and blood clots, soaking through a sanitary pad in less than 1 hour. These symptoms may represent a serious problem that is an emergency. Do not wait to see if the symptoms will go away. Get medical help right away. Call your local emergency services (911 in the [place] Do not drive yourself to the hospital. Summary  After the procedure, it is common to have abdominal pain and vaginal bleeding.  Wear a sanitary pad for vaginal discharge or bleeding.  You should not drive or lift heavy objects until your health care provider says that it is safe.  Contact your health care provider if you have any symptoms of infection, heavy vaginal bleeding, nausea, vomiting, or shortness of breath. This information is not intended to replace advice given to you by your health care  provider. Make sure you discuss any questions you have with your health care provider. Document Revised: 03/24/2020 Document Reviewed: 03/24/2020 Elsevier Patient Education  [place].

## 2020-09-07 NOTE — Progress Notes (Signed)
  Postoperative Follow-up Patient presents post op from robotic assisted laparoscopic hysterectomy and bilateral salpingectomy for fibroids, 1 week ago.  Subjective: Patient reports some improvement in her preop symptoms. Some suprapubic pain and pain with urination.  Eating a regular diet without difficulty. Pain is controlled with current analgesics. Medications being used: ibuprofen (OTC) and roxicodone at night to help with sleep.  Activity: sedentary. Patient reports additional symptom's since surgery of Abd pain.  Objective: BP 114/74   Ht 5\' 4"  (1.626 m)   Wt 123 lb 3.2 oz (55.9 kg)   BMI 21.15 kg/m  Physical Exam Constitutional:      Appearance: She is well-developed.  Genitourinary:     Vagina and uterus normal.     There is no lesion on the right labia.     There is no lesion on the left labia.    No lesions in the vagina.      Right Adnexa: no mass present.    Left Adnexa: no mass present.    No cervical motion tenderness.  Breasts:     Right: No inverted nipple, mass, nipple discharge or skin change.     Left: No inverted nipple, mass, nipple discharge or skin change.    HENT:     Head: Normocephalic and atraumatic.  Eyes:     Extraocular Movements: EOM normal.  Neck:     Thyroid: No thyromegaly.  Cardiovascular:     Rate and Rhythm: Normal rate and regular rhythm.     Heart sounds: Normal heart sounds.  Pulmonary:     Effort: Pulmonary effort is normal.     Breath sounds: Normal breath sounds.  Abdominal:     General: Abdomen is flat. Bowel sounds are normal. There is no distension.     Palpations: Abdomen is soft. There is no mass.     Comments: Incisions are clean, dry and intact  Musculoskeletal:     Cervical back: Neck supple.  Neurological:     Mental Status: She is alert and oriented to person, place, and time.  Skin:    General: Skin is warm and dry.  Psychiatric:        Mood and Affect: Mood and affect normal.        Behavior: Behavior normal.         Thought Content: Thought content normal.        Judgment: Judgment normal.  Vitals reviewed.    Assessment: s/p : robotic assisted laparoscopic hysterectomy and bilateral salpingectomy stable  Plan: Patient has done well after surgery with no apparent complications.  I have discussed the post-operative course to date, and the expected progress moving forward.  The patient understands what complications to be concerned about.  I will see the patient in routine follow up, or sooner if needed.    UA for dysuria- normal  Activity plan: No heavy lifting. Pelvic rest.  Adrian Prows MD, [place], [facility] 09/07/2020 11:25 AM

## 2020-09-07 NOTE — Addendum Note (Signed)
Addended by: Vernia Buff A on: 09/07/2020 11:47 AM   Modules accepted: Orders

## 2020-10-11 ENCOUNTER — Other Ambulatory Visit: Payer: Self-pay

## 2020-10-11 ENCOUNTER — Encounter: Payer: Self-pay | Admitting: Obstetrics and Gynecology

## 2020-10-11 ENCOUNTER — Ambulatory Visit (INDEPENDENT_AMBULATORY_CARE_PROVIDER_SITE_OTHER): Payer: No Typology Code available for payment source | Admitting: Obstetrics and Gynecology

## 2020-10-11 VITALS — BP 112/70 | Ht 64.0 in | Wt 126.8 lb

## 2020-10-11 DIAGNOSIS — Z90710 Acquired absence of both cervix and uterus: Secondary | ICD-10-CM

## 2020-10-11 NOTE — Progress Notes (Signed)
  Postoperative Follow-up Patient presents post op from  [facility] Robot-assisted Laparoscopic Hysterectomy and Bilateral Salpingectomy  for  Uterine fibroids , 6 weeks ago.  Subjective: Patient reports some improvement in her preop symptoms. Eating a regular diet without difficulty. Pain is controlled without any medications.  Activity: walking 25-30 minutes a day, running 5 minutes Patient reports additional symptom's since surgery of Abd pain, Fatigue.  Objective: BP 112/70   Ht 5\' 4"  (1.626 m)   Wt 126 lb 12.8 oz (57.5 kg)   LMP 07/25/2020   BMI 21.77 kg/m  Physical Exam Constitutional:      Appearance: She is well-developed.  Genitourinary:     Genitourinary Comments: External: Normal appearing vulva. No lesions noted.  Speculum examination:cuff intact, suture present Bimanual examination: cuff palpates intact    HENT:     Head: Normocephalic and atraumatic.  Neck:     Thyroid: No thyromegaly.  Cardiovascular:     Rate and Rhythm: Normal rate and regular rhythm.     Heart sounds: Normal heart sounds.  Pulmonary:     Effort: Pulmonary effort is normal.     Breath sounds: Normal breath sounds.  Abdominal:     General: Bowel sounds are normal. There is no distension.     Palpations: Abdomen is soft. There is no mass.  Musculoskeletal:     Cervical back: Neck supple.  Neurological:     Mental Status: She is alert and oriented to person, place, and time.  Skin:    General: Skin is warm and dry.  Psychiatric:        Behavior: Behavior normal.        Thought Content: Thought content normal.        Judgment: Judgment normal.  Vitals reviewed.     Assessment: s/p :  [facility] Robot-assisted Laparoscopic Hysterectomy and Bilateral Salpingectomy  stable  Plan: Patient has done well after surgery with no apparent complications.  I have discussed the post-operative course to date, and the expected progress moving forward.  The patient understands what complications to be concerned  about.  I will see the patient in routine follow up, or sooner if needed.    Activity plan: No heavy lifting. Pelvic rest.  Follow up in 4 weeks  Adrian Prows MD, [place], [facility] 10/11/2020 10:28 AM

## 2020-11-07 ENCOUNTER — Ambulatory Visit: Payer: No Typology Code available for payment source | Admitting: Obstetrics and Gynecology

## 2020-11-22 ENCOUNTER — Other Ambulatory Visit: Payer: Self-pay

## 2020-11-22 ENCOUNTER — Ambulatory Visit (INDEPENDENT_AMBULATORY_CARE_PROVIDER_SITE_OTHER): Payer: No Typology Code available for payment source | Admitting: Obstetrics and Gynecology

## 2020-11-22 ENCOUNTER — Encounter: Payer: Self-pay | Admitting: Obstetrics and Gynecology

## 2020-11-22 VITALS — BP 114/70 | Ht 64.0 in | Wt 126.2 lb

## 2020-11-22 DIAGNOSIS — Z90710 Acquired absence of both cervix and uterus: Secondary | ICD-10-CM

## 2020-11-22 NOTE — Progress Notes (Signed)
  Postoperative Follow-up Patient presents post op from  [facility] Robot-assisted Laparoscopic Hysterectomy and Bilateral Salpingectomy  for  menorrhagia , 3 months ago.  Subjective: Patient reports some improvement in her preop symptoms. Eating a regular diet without difficulty. Pain is controlled without any medications.  Activity: normal activities of daily living. Patient reports additional symptom's since surgery of slight discomfort with vaginal intercourse. Felt some cramping pain when it was the time of the month that she expected her menstrual cycle. Denies vaginal bleeding. She is overall very happy with no longer having to worry about her heavy bleeding and frequent accidents.   Objective: BP 114/70   Ht 5\' 4"  (1.626 m)   Wt 126 lb 3.2 oz (57.2 kg)   LMP 07/25/2020   BMI 21.66 kg/m  Physical Exam Constitutional:      Appearance: She is well-developed.  Genitourinary:     Genitourinary Comments: External: Normal appearing vulva. No lesions noted.  Speculum examination: Normal appearing vaginal cuff. Bimanual examination: Cuff palpates intact.  Breast Exam: exam not performed   HENT:     Head: Normocephalic and atraumatic.  Neck:     Thyroid: No thyromegaly.  Cardiovascular:     Rate and Rhythm: Normal rate and regular rhythm.     Heart sounds: Normal heart sounds.  Pulmonary:     Effort: Pulmonary effort is normal.     Breath sounds: Normal breath sounds.  Abdominal:     General: Bowel sounds are normal. There is no distension.     Palpations: Abdomen is soft. There is no mass.     Comments: Incisions are clean dry and intact  Musculoskeletal:     Cervical back: Neck supple.  Neurological:     Mental Status: She is alert and oriented to person, place, and time.  Skin:    General: Skin is warm and dry.  Psychiatric:        Behavior: Behavior normal.        Thought Content: Thought content normal.        Judgment: Judgment normal.  Vitals reviewed.     Assessment: s/p :  [facility] Robot-assisted Laparoscopic Hysterectomy and Bilateral Salpingectomy  stable  Plan: Patient has done well after surgery with no apparent complications.  I have discussed the post-operative course to date, and the expected progress moving forward.  The patient understands what complications to be concerned about.  I will see the patient in routine follow up, or sooner if needed.    Activity plan: No restriction. Follow up as needed. Continue yearly bimanual exam to access ovaries. Can discontinue pap smears.  Adrian Prows MD, [place], [facility] 11/22/2020 8:43 PM

## 2020-12-11 ENCOUNTER — Other Ambulatory Visit: Payer: Self-pay | Admitting: Obstetrics and Gynecology

## 2020-12-11 ENCOUNTER — Telehealth: Payer: Self-pay

## 2020-12-11 DIAGNOSIS — R1084 Generalized abdominal pain: Secondary | ICD-10-CM

## 2020-12-11 NOTE — Telephone Encounter (Signed)
Pink discharge after intercourse. Feeling gaseous. Reports that her poop is a light sand color and has been for a month. Her abdomen is tender to the touch. She reports that he pain is usually a few hours after eating. Placed order for [place]. Recommended PCP or urgent care follow up. Reassurance about small pink spotting after intercourse.

## 2020-12-11 NOTE — Progress Notes (Signed)
Ordered US for abdominal pain after eating. Patient notes light colored stools. Recommended lab evaluation with PCP, urgent care or our office if needed.   Small pink spotting with intercourse. Denies pain. Reassurance given, could be evaluated in office if it does not resolve or bleeding worsens.   Adrian Prows MD, [facility], [facility] 12/11/2020 5:57 PM

## 2020-12-11 NOTE — Telephone Encounter (Signed)
Pt calling; had hyst 08/31/20; is having issues c bowels - pain, pressure just to touch, bleeding c IC, gas, bloating; times 3wks.  [phone]

## 2021-01-05 ENCOUNTER — Ambulatory Visit: Payer: No Typology Code available for payment source

## 2021-01-08 ENCOUNTER — Ambulatory Visit: Payer: No Typology Code available for payment source | Admitting: Obstetrics and Gynecology

## 2021-01-12 ENCOUNTER — Ambulatory Visit (INDEPENDENT_AMBULATORY_CARE_PROVIDER_SITE_OTHER): Payer: No Typology Code available for payment source

## 2021-01-12 ENCOUNTER — Ambulatory Visit
Admission: EM | Admit: 2021-01-12 | Discharge: 2021-01-12 | Disposition: A | Discharge status: Home / Self Care | Payer: No Typology Code available for payment source

## 2021-01-12 ENCOUNTER — Other Ambulatory Visit: Payer: Self-pay

## 2021-01-12 DIAGNOSIS — R079 Chest pain, unspecified: Secondary | ICD-10-CM

## 2021-01-12 DIAGNOSIS — F419 Anxiety disorder, unspecified: Secondary | ICD-10-CM | POA: Diagnosis not present

## 2021-01-12 DIAGNOSIS — R0789 Other chest pain: Secondary | ICD-10-CM

## 2021-01-12 MED ORDER — IBUPROFEN 800 MG PO TABS
800.0000 mg | ORAL_TABLET | ORAL | Status: AC
  Administered 2021-01-12: 11:00:00 800 mg via ORAL

## 2021-01-12 NOTE — ED Provider Notes (Addendum)
[facility]    CSN: [id number] Arrival date & time: 01/12/21  1043      History   Chief Complaint Chief Complaint  Patient presents with   Chest Pain    HPI Leah Moses is a 45 y.o. female presenting for acute onset of diffuse pressure-like chest discomfort while she was driving to work 10 minutes to arrival to [facility] urgent care.  Patient says she is going to a job site and she is particularly stressed about that.  She said she became concerned because she has never had chest pain before.  It does not radiate.  She says that it felt like a pressure and felt like it was difficult to breathe.  She says that she still feels like it is a little difficult to breathe but the pain has improved.  Pain is little worse whenever she takes a deep breath or palpates her chest.  Pain additionally worsened over she lays backward and moves certain ways.  She denies any associated dizziness or weakness.  No palpitations or wheezing.  No leg swelling.  Patient denies any history of cardiopulmonary disease.  She has not been sick recently.  No fevers, cough or congestion.  No known COVID exposure.  Patient has not take anything for the discomfort.  No calf or leg pain.  She has no other complaints or concerns.  HPI  Past Medical History:  Diagnosis Date   Dysmenorrhea    Family history of adverse reaction to anesthesia    Sister with N/V   Melanoma ([place])    Chest, leg, shoulder, back, ankle   PONV (postoperative nausea and vomiting)     Patient Active Problem List   Diagnosis Date Noted   Intramural leiomyoma of uterus    Menorrhagia, premenopausal    Menorrhagia with regular cycle    Dysplastic nevus 10/26/2012    Past Surgical History:  Procedure Laterality Date   BREAST ENHANCEMENT SURGERY  2007   ENDOMETRIAL ABLATION  2004   MANDIBLE SURGERY     MELANOMA EXCISION     Multiple sites/dates IN OFFICE   ROBOTIC ASSISTED LAPAROSCOPIC HYSTERECTOMY AND SALPINGECTOMY Bilateral  08/31/2020   Procedure: XI ROBOTIC ASSISTED LAPAROSCOPIC HYSTERECTOMY AND SALPINGECTOMY;  Surgeon: Homero Fellers, MD;  Location: [facility] ORS;  Service: Gynecology;  Laterality: Bilateral;    OB History     Gravida  2   Para  1   Term  1   Preterm      AB  1   Living  1      SAB  1   IAB      Ectopic      Multiple      Live Births  1            Home Medications    Prior to Admission medications   Medication Sig Start Date End Date Taking? Authorizing Provider  ascorbic acid (VITAMIN C) 1000 MG tablet Take 1,000 mg by mouth daily.   Yes [provider]  ibuprofen (ADVIL) 600 MG tablet Take 1 tablet (600 mg total) by mouth every 6 (six) hours as needed. 08/31/20  Yes Schuman, Christanna R, MD  MYORISAN 40 MG capsule Take 40 mg by mouth daily. 12/22/20  Yes [provider]  acetaminophen (TYLENOL) 500 MG tablet Take 1 tablet (500 mg total) by mouth every 6 (six) hours as needed for moderate pain. 08/31/20   Schuman, Stefanie Libel, MD  Ibuprofen-diphenhydrAMINE Cit (ADVIL PM PO) Take 1  tablet by mouth at bedtime as needed.    [provider]  metoCLOPramide (REGLAN) 10 MG tablet Take 1 tablet (10 mg total) by mouth 3 (three) times daily before meals. 09/04/20   Schuman, Stefanie Libel, MD  polyethylene glycol (MIRALAX) 17 g packet Take 17 g by mouth daily as needed. 09/04/20   Homero Fellers, MD    Family History Family History  Problem Relation Age of Onset   Lung cancer Mother     Social History Social History   Tobacco Use   Smoking status: Never   Smokeless tobacco: Never  Vaping Use   Vaping Use: Never used  Substance Use Topics   Alcohol use: Yes    Comment: rarely   Drug use: Never     Allergies   Patient has no known allergies.   Review of Systems Review of Systems  Constitutional:  Negative for fatigue and fever.  HENT:  Negative for congestion.   Respiratory:  Positive for shortness of breath. Negative  for cough and wheezing.   Cardiovascular:  Positive for chest pain. Negative for palpitations and leg swelling.  Gastrointestinal:  Negative for abdominal pain, nausea and vomiting.  Musculoskeletal:  Negative for back pain.  Skin:  Negative for rash.  Neurological:  Positive for headaches. Negative for dizziness and weakness.  Psychiatric/Behavioral:  The patient is nervous/anxious.     Physical Exam Triage Vital Signs ED Triage Vitals [01/12/21 1046]  Enc Vitals Group     BP 116/81     Pulse Rate 90     Resp 18     Temp 97.8 F (36.6 C)     Temp Source Oral     SpO2 100 %     Weight 120 lb (54.4 kg)     Height 5\' 4"  (1.626 m)     Head Circumference      Peak Flow      Pain Score 4     Pain Loc      Pain Edu?      Excl. in [place]?    No data found.  Updated Vital Signs BP 116/81 (BP Location: Left Arm)   Pulse 90   Temp 97.8 F (36.6 C) (Oral)   Resp 18   Ht 5\' 4"  (1.626 m)   Wt 120 lb (54.4 kg)   LMP 07/25/2020   SpO2 100%   BMI 20.60 kg/m       Physical Exam Vitals and nursing note reviewed.  Constitutional:      General: She is not in acute distress.    Appearance: Normal appearance. She is not ill-appearing or toxic-appearing.  HENT:     Head: Normocephalic and atraumatic.     Nose: Nose normal.     Mouth/Throat:     Mouth: Mucous membranes are moist.     Pharynx: Oropharynx is clear.  Eyes:     General: No scleral icterus.       Right eye: No discharge.        Left eye: No discharge.     Conjunctiva/sclera: Conjunctivae normal.  Cardiovascular:     Rate and Rhythm: Normal rate and regular rhythm.     Heart sounds: Normal heart sounds.  Pulmonary:     Effort: Pulmonary effort is normal. No respiratory distress.     Breath sounds: Normal breath sounds.  Chest:     Chest wall: Tenderness (TTP along sternum) present.  Abdominal:     Palpations: Abdomen is soft.  Tenderness: There is no abdominal tenderness.  Musculoskeletal:     Cervical  back: Neck supple.  Skin:    General: Skin is dry.  Neurological:     General: No focal deficit present.     Mental Status: She is alert. Mental status is at baseline.     Motor: No weakness.     Gait: Gait normal.  Psychiatric:        Attention and Perception: Attention normal.        Mood and Affect: Mood is anxious (Patient speaks rapidly).        Behavior: Behavior normal.        Thought Content: Thought content normal.     UC Treatments / Results  Labs (all labs ordered are listed, but only abnormal results are displayed) Labs Reviewed - No data to display  EKG   Radiology DG Chest 2 View  Result Date: 01/12/2021 CLINICAL DATA:  Chest pain. EXAM: CHEST - 2 VIEW COMPARISON:  None. FINDINGS: The heart size and mediastinal contours are within normal limits. Both lungs are clear. The visualized skeletal structures are unremarkable. IMPRESSION: No active cardiopulmonary disease. Electronically Signed   By: Marijo Conception M.D.   On: 01/12/2021 11:41    Procedures ED EKG  Date/Time: 01/12/2021 11:06 AM Performed by: Danton Clap, PA-C Authorized by: Melynda Ripple, MD   ECG reviewed by ED Physician in the absence of a cardiologist: yes   Previous ECG:    Previous ECG:  Unavailable Interpretation:    Interpretation: normal   Rate:    ECG rate:  66   ECG rate assessment: normal   Rhythm:    Rhythm: sinus rhythm   Ectopy:    Ectopy: none   QRS:    QRS axis:  Normal   QRS intervals:  Normal   QRS conduction: normal   ST segments:    ST segments:  Normal T waves:    T waves: normal   Comments:     Normal sinus rhythm and regular rate (including critical care time)  Medications Ordered in UC Medications  ibuprofen (ADVIL) tablet 800 mg (800 mg Oral Given 01/12/21 1109)    Initial Impression / Assessment and Plan / UC Course  I have reviewed the triage vital signs and the nursing notes.  Pertinent labs & imaging results that were available during my care  of the patient were reviewed by me and considered in my medical decision making (see chart for details).  45 year old female presenting for diffuse chest pain over the past hour.  Pain was initially 7 out of 10 and is now at about a 3 out of 10.  Describes it as a pressure that makes it feel like it is difficult to breathe.  In the clinic today all vital signs are normal and stable.  Her oxygen is 100%.  She does appear somewhat anxious and speaks rapidly.  Exam significant for tenderness to palpation of her sternum.  EKG performed today is within normal limits.  Normal sinus rhythm and regular rate at 66 bpm.  Chest x-ray ordered today.  Chest x-ray independently viewed by me.  Chest x-ray is within normal limits.  I reviewed the results of the EKG and the chest x-ray with patient.  She was previously given 800 mg Motrin.  Says that this did bring her pain down a little bit to the 3 out of 10.  She questions if her symptoms could be related to "anxiety attack."  Advised patient that given the circumstances that exacerbate her symptoms which include palpating the area, moving certain ways and taking big breaths this suggests musculoskeletal cause.  But, she is under increased stress at work and does report that.  Advised this can be a contributing factor.  Advised NSAIDs, rest and fluids and try to decrease stress today.  Patient states that she is going to cancel the appointment she had today and go home and rest.  We did thoroughly review ED red flag signs and symptoms.  She is going home where someone will be able to be with her.  Discussed going to ED for worsening of her pain or if there is any association to dizziness, weakness, nausea/vomiting, sweats, palpitations, or increased breathing difficulty.  Advised to follow-up PCP if symptoms are ongoing and could be related to anxiety.  Patient agreeable to plan.   Final Clinical Impressions(s) / UC Diagnoses   Final diagnoses:  Chest wall pain  Anxiety      Discharge Instructions      Your EKG is normal.  The chest x-ray is also normal.  Given that your chest is tender to touch and you have increased pain with breathing and moving certain ways, this suggest musculoskeletal cause for your chest pain.  I would advise over-the-counter NSAIDs and Tylenol and rest.  You should start to feel better in the next couple of days but if your symptoms worsen or are associated with any other red flags below, you should call 911 or have someone take him to the emergency department.  As we discussed, there is also the element of possible increased stress anxiety so I would definitely go home and take it easy today.  If your symptoms continue then he should follow-up with your primary care provider.  CHEST PAIN RED FLAGS:  The main red flags to look out for are; pain in the center of the chest, difficulty breathing, nausea, sweating, clammy skin, dizziness, pain radiating to the jaw/neck/arm, racing heart, etc. If any of those symptoms are present, you must immediately get to the ER.      ED Prescriptions   None    PDMP not reviewed this encounter.   Danton Clap, PA-C 01/12/21 1155    Laurene Footman B, PA-C 01/12/21 1155

## 2021-01-12 NOTE — Discharge Instructions (Addendum)
Your EKG is normal.  The chest x-ray is also normal.  Given that your chest is tender to touch and you have increased pain with breathing and moving certain ways, this suggest musculoskeletal cause for your chest pain.  I would advise over-the-counter NSAIDs and Tylenol and rest.  You should start to feel better in the next couple of days but if your symptoms worsen or are associated with any other red flags below, you should call 911 or have someone take him to the emergency department.  As we discussed, there is also the element of possible increased stress anxiety so I would definitely go home and take it easy today.  If your symptoms continue then he should follow-up with your primary care provider.  CHEST PAIN RED FLAGS:  The main red flags to look out for are; pain in the center of the chest, difficulty breathing, nausea, sweating, clammy skin, dizziness, pain radiating to the jaw/neck/arm, racing heart, etc. If any of those symptoms are present, you must immediately get to the ER.

## 2021-01-12 NOTE — ED Triage Notes (Signed)
Patient complains of chest pain that started approx 10 mins ago. States that the pain is all across her chest. States that she has a tightness feeling, chills and has shortness of breath.

## 2021-03-02 ENCOUNTER — Telehealth: Payer: No Typology Code available for payment source

## 2021-03-02 NOTE — Telephone Encounter (Signed)
Pt calling; is having some bleeding after IC; has had hyst and doesn't think this should be happening.  [phone] Left detailed msg to sched appt.

## 2021-05-04 ENCOUNTER — Telehealth: Payer: Self-pay

## 2021-05-04 NOTE — Telephone Encounter (Signed)
Pt calling; is having bleeding issues; wants to talk to her doctor about it.  [phone]  Pt has had a hyst; started bleeding after IC and has been bleeding for a day and a half.

## 2021-05-04 NOTE — Telephone Encounter (Signed)
Please schedule her for a phone visit or a office visit

## 2021-05-08 NOTE — Telephone Encounter (Signed)
Called an left message for patient to call back to confirm

## 2021-05-21 ENCOUNTER — Ambulatory Visit (INDEPENDENT_AMBULATORY_CARE_PROVIDER_SITE_OTHER): Payer: No Typology Code available for payment source | Admitting: Obstetrics and Gynecology

## 2021-05-21 ENCOUNTER — Other Ambulatory Visit: Payer: Self-pay

## 2021-05-21 ENCOUNTER — Encounter: Payer: Self-pay | Admitting: Obstetrics and Gynecology

## 2021-05-21 VITALS — BP 114/72 | Ht 64.0 in | Wt 127.8 lb

## 2021-05-21 DIAGNOSIS — N930 Postcoital and contact bleeding: Secondary | ICD-10-CM | POA: Diagnosis not present

## 2021-05-21 NOTE — Progress Notes (Signed)
Patient ID: Leah Moses, female   DOB: August 11, 1975, 45 y.o.   MRN: [id number]  Reason for Consult: Gynecologic Exam   Referred by No ref. provider found  Subjective:     HPI:  Leah Moses is a 45 y.o. female. She has had a hysterectomy. She recently had bleeding after intercourse.   Gynecological History  Patient's last menstrual period was 07/25/2020.   Past Medical History:  Diagnosis Date   Dysmenorrhea    Family history of adverse reaction to anesthesia    Sister with N/V   Melanoma ([place])    Chest, leg, shoulder, back, ankle   PONV (postoperative nausea and vomiting)    Family History  Problem Relation Age of Onset   Lung cancer Mother    Past Surgical History:  Procedure Laterality Date   BREAST ENHANCEMENT SURGERY  2007   ENDOMETRIAL ABLATION  2004   MANDIBLE SURGERY     MELANOMA EXCISION     Multiple sites/dates IN OFFICE   ROBOTIC ASSISTED LAPAROSCOPIC HYSTERECTOMY AND SALPINGECTOMY Bilateral 08/31/2020   Procedure: XI ROBOTIC ASSISTED LAPAROSCOPIC HYSTERECTOMY AND SALPINGECTOMY;  Surgeon: Homero Fellers, MD;  Location: [facility] ORS;  Service: Gynecology;  Laterality: Bilateral;    Short Social History:  Social History   Tobacco Use   Smoking status: Never   Smokeless tobacco: Never  Substance Use Topics   Alcohol use: Yes    Comment: rarely    No Known Allergies  Current Outpatient Medications  Medication Sig Dispense Refill   acetaminophen (TYLENOL) 500 MG tablet Take 1 tablet (500 mg total) by mouth every 6 (six) hours as needed for moderate pain. 60 tablet 0   ascorbic acid (VITAMIN C) 1000 MG tablet Take 1,000 mg by mouth daily.     ibuprofen (ADVIL) 600 MG tablet Take 1 tablet (600 mg total) by mouth every 6 (six) hours as needed. 60 tablet 0   Ibuprofen-diphenhydrAMINE Cit (ADVIL PM PO) Take 1 tablet by mouth at bedtime as needed.     metoCLOPramide (REGLAN) 10 MG tablet Take 1 tablet (10 mg total) by mouth 3 (three) times daily  before meals. 30 tablet 0   MYORISAN 40 MG capsule Take 40 mg by mouth daily.     polyethylene glycol (MIRALAX) 17 g packet Take 17 g by mouth daily as needed. 10 each 0   No current facility-administered medications for this visit.    REVIEW OF SYSTEMS      Objective:  Objective   Vitals:   05/21/21 1627  BP: 114/72  Weight: 127 lb 12.8 oz (58 kg)  Height: 5\' 4"  (1.626 m)   Body mass index is 21.94 kg/m.  Physical Exam Vitals and nursing note reviewed. Exam conducted with a chaperone present.  Constitutional:      Appearance: Normal appearance. She is well-developed.  HENT:     Head: Normocephalic and atraumatic.  Eyes:     Extraocular Movements: Extraocular movements intact.     Pupils: Pupils are equal, round, and reactive to light.  Cardiovascular:     Rate and Rhythm: Normal rate and regular rhythm.  Pulmonary:     Effort: Pulmonary effort is normal. No respiratory distress.     Breath sounds: Normal breath sounds.  Abdominal:     General: Abdomen is flat.     Palpations: Abdomen is soft.  Genitourinary:    Comments: External: Normal appearing vulva. No lesions noted.  Speculum examination: Normal appearing vaginal cuff. Small granulation tissue present. Cuff intact.  Musculoskeletal:        General: No signs of injury.  Skin:    General: Skin is warm and dry.  Neurological:     Mental Status: She is alert and oriented to person, place, and time.  Psychiatric:        Behavior: Behavior normal.        Thought Content: Thought content normal.        Judgment: Judgment normal.    Assessment/Plan:     46 s/p hysterectomy with postcoital bleeding Silver nitrate appled to left cuff of the vagina Small bleeding noted.  Follow up as needed.   More than 5 minutes were spent face to face with the patient in the room, reviewing the medical record, labs and images, and coordinating care for the patient. The plan of management was discussed in detail and  counseling was provided.      Adrian Prows MD [facility] 05/21/2021 4:55 PM

## 2021-11-15 IMAGING — CR DG CHEST 2V
2 series · 2 of 2 positions shown · non-contrast
Comparison: None.

CLINICAL DATA: Chest pain.

EXAM:
CHEST - 2 VIEW

[chest pa]
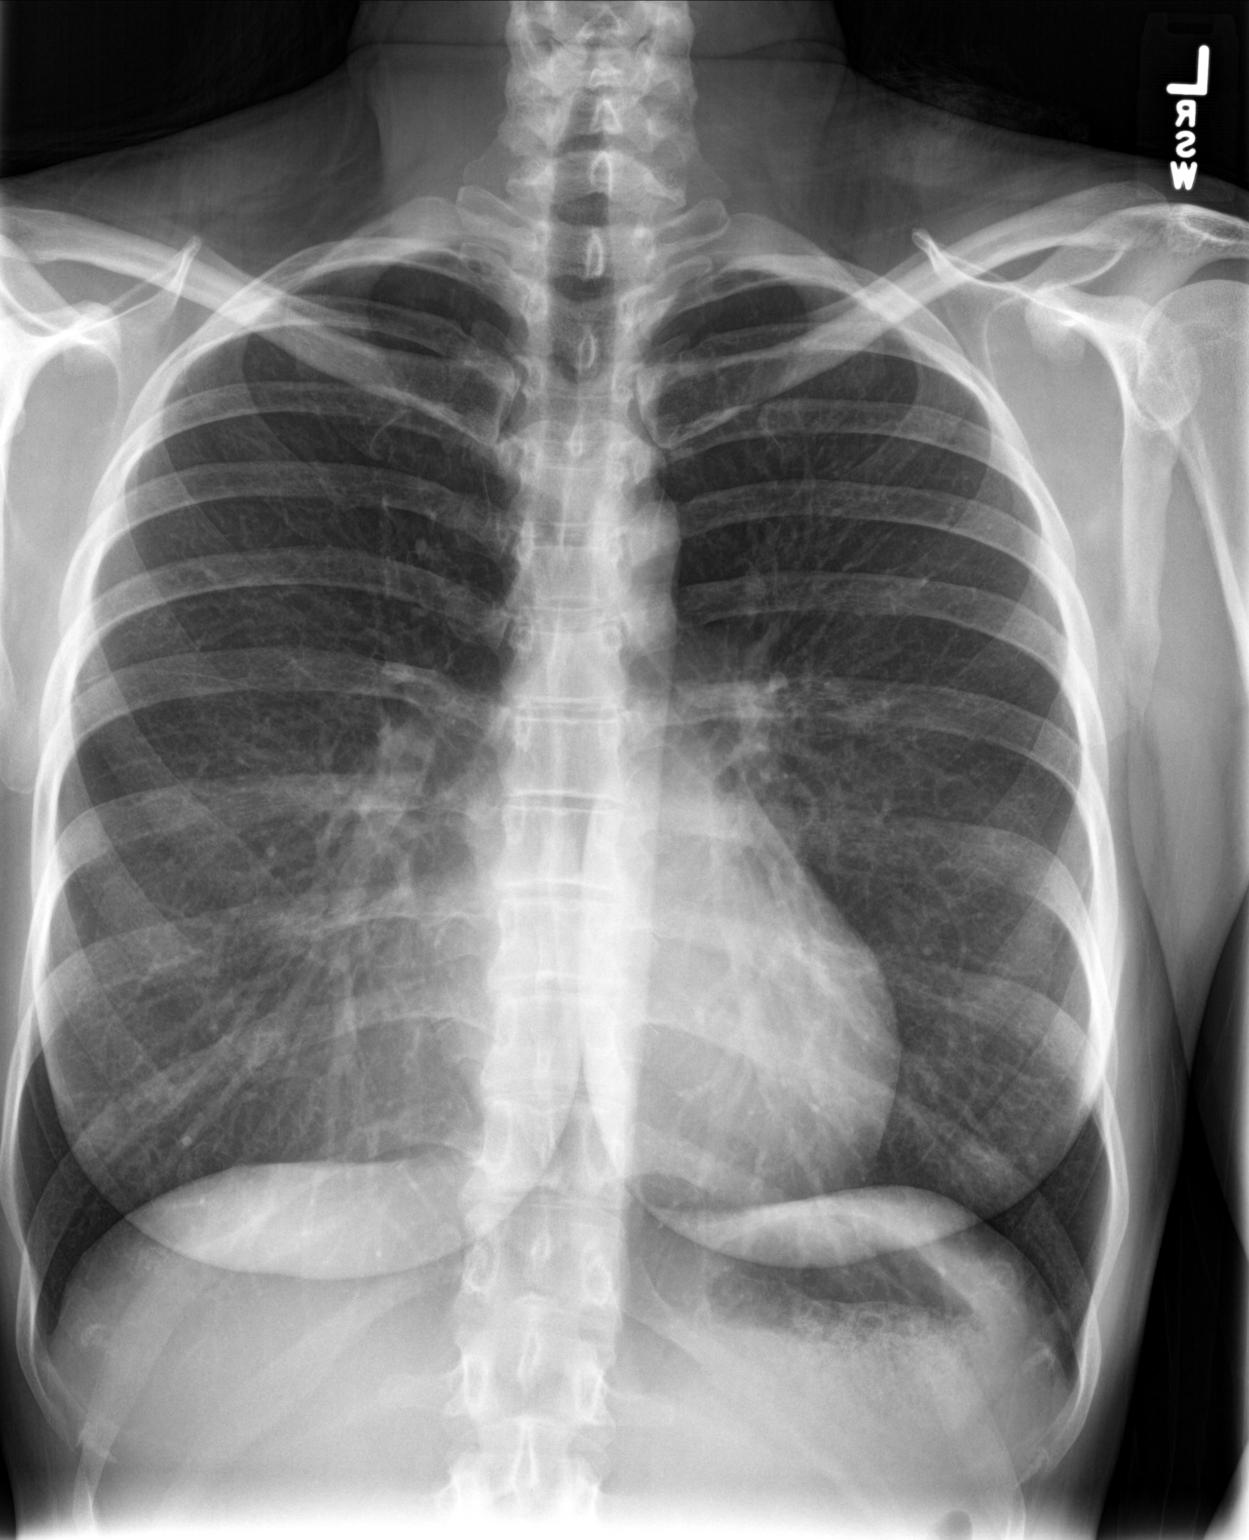

[chest lat]
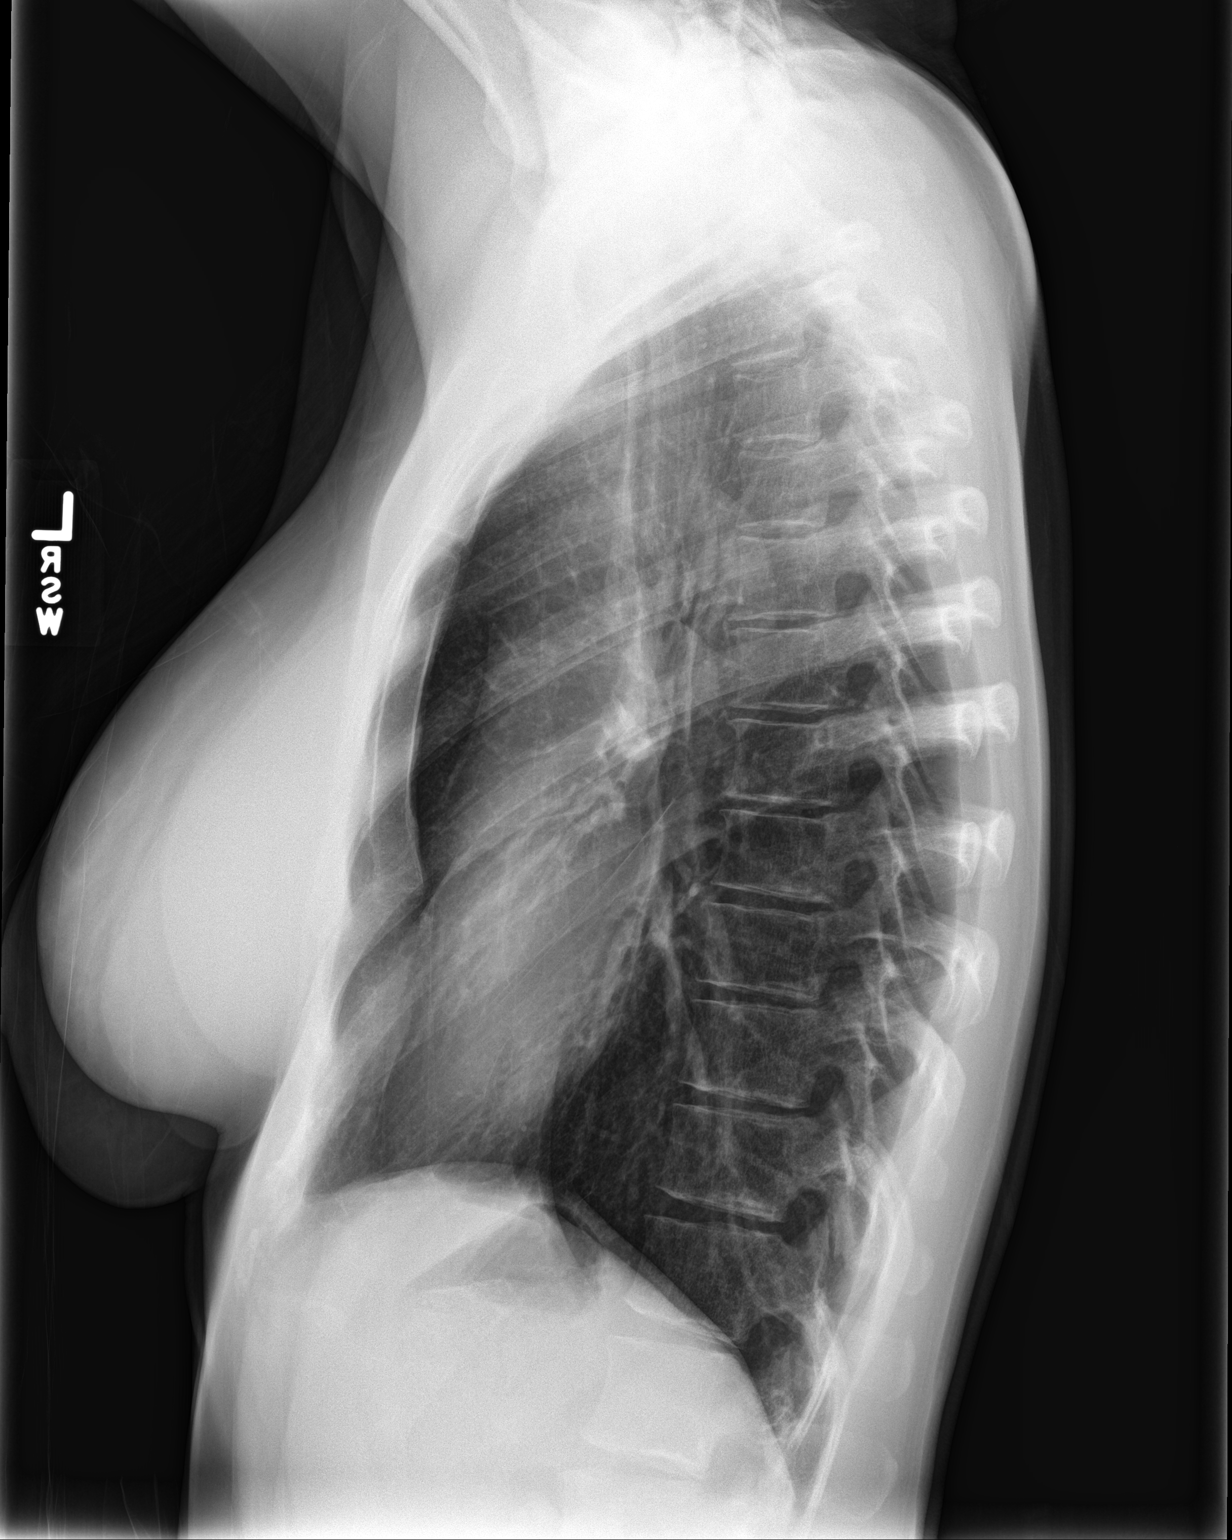

[2 of 2 positions shown; findings below may reference images not displayed]

FINDINGS: The heart size and mediastinal contours are within normal limits.
Both lungs are clear. The visualized skeletal structures are
unremarkable.
IMPRESSION: No active cardiopulmonary disease.
